# Patient Record
Sex: Male | Born: 1980 | Race: White | Hispanic: No | Marital: Married | Smoking: Current every day smoker
Health system: Southern US, Community
[De-identification: ages and names within clinical notes are randomized; demographics above are authoritative.]

## PROBLEM LIST (undated history)

## (undated) DIAGNOSIS — N201 Calculus of ureter: Secondary | ICD-10-CM

## (undated) DIAGNOSIS — Z8489 Family history of other specified conditions: Secondary | ICD-10-CM

## (undated) DIAGNOSIS — R51 Headache: Secondary | ICD-10-CM

## (undated) DIAGNOSIS — R3915 Urgency of urination: Secondary | ICD-10-CM

## (undated) DIAGNOSIS — R300 Dysuria: Secondary | ICD-10-CM

## (undated) DIAGNOSIS — R319 Hematuria, unspecified: Secondary | ICD-10-CM

## (undated) DIAGNOSIS — N200 Calculus of kidney: Secondary | ICD-10-CM

## (undated) DIAGNOSIS — R519 Headache, unspecified: Secondary | ICD-10-CM

---
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
## 2008-08-01 ENCOUNTER — Emergency Department (HOSPITAL_COMMUNITY): Admission: EM | Admit: 2008-08-01 | Discharge: 2008-08-01 | Payer: Self-pay | Admitting: Emergency Medicine

## 2017-09-10 ENCOUNTER — Emergency Department (HOSPITAL_COMMUNITY): Payer: Self-pay

## 2017-09-10 ENCOUNTER — Emergency Department (HOSPITAL_COMMUNITY)
Admission: EM | Admit: 2017-09-10 | Discharge: 2017-09-11 | Disposition: A | Discharge status: Home / Self Care | Payer: Self-pay | Attending: Emergency Medicine | Admitting: Emergency Medicine

## 2017-09-10 ENCOUNTER — Other Ambulatory Visit: Payer: Self-pay

## 2017-09-10 ENCOUNTER — Encounter (HOSPITAL_COMMUNITY): Payer: Self-pay | Admitting: *Deleted

## 2017-09-10 DIAGNOSIS — N132 Hydronephrosis with renal and ureteral calculous obstruction: Secondary | ICD-10-CM | POA: Insufficient documentation

## 2017-09-10 DIAGNOSIS — N201 Calculus of ureter: Secondary | ICD-10-CM | POA: Insufficient documentation

## 2017-09-10 DIAGNOSIS — F17210 Nicotine dependence, cigarettes, uncomplicated: Secondary | ICD-10-CM | POA: Insufficient documentation

## 2017-09-10 LAB — URINALYSIS, ROUTINE W REFLEX MICROSCOPIC
BACTERIA UA: NONE SEEN
Bilirubin Urine: NEGATIVE
Glucose, UA: 50 mg/dL — AB
KETONES UR: NEGATIVE mg/dL
NITRITE: NEGATIVE
PROTEIN: NEGATIVE mg/dL
Specific Gravity, Urine: 1.02 (ref 1.005–1.030)
pH: 5 (ref 5.0–8.0)

## 2017-09-10 LAB — LIPASE, BLOOD: Lipase: 34 U/L (ref 11–51)

## 2017-09-10 LAB — CBC WITH DIFFERENTIAL/PLATELET
BASOS PCT: 0 %
Basophils Absolute: 0 10*3/uL (ref 0.0–0.1)
Eosinophils Absolute: 0.3 10*3/uL (ref 0.0–0.7)
Eosinophils Relative: 4 %
HEMATOCRIT: 46.4 % (ref 39.0–52.0)
Hemoglobin: 15.8 g/dL (ref 13.0–17.0)
LYMPHS ABS: 3 10*3/uL (ref 0.7–4.0)
Lymphocytes Relative: 32 %
MCH: 31.3 pg (ref 26.0–34.0)
MCHC: 34.1 g/dL (ref 30.0–36.0)
MCV: 91.9 fL (ref 78.0–100.0)
MONOS PCT: 10 %
Monocytes Absolute: 1 10*3/uL (ref 0.1–1.0)
NEUTROS ABS: 5.1 10*3/uL (ref 1.7–7.7)
NEUTROS PCT: 54 %
Platelets: 219 10*3/uL (ref 150–400)
RBC: 5.05 MIL/uL (ref 4.22–5.81)
RDW: 13.9 % (ref 11.5–15.5)
WBC: 9.4 10*3/uL (ref 4.0–10.5)

## 2017-09-10 LAB — COMPREHENSIVE METABOLIC PANEL
ALBUMIN: 3.8 g/dL (ref 3.5–5.0)
ALT: 25 U/L (ref 17–63)
AST: 19 U/L (ref 15–41)
Alkaline Phosphatase: 68 U/L (ref 38–126)
Anion gap: 9 (ref 5–15)
BUN: 16 mg/dL (ref 6–20)
CHLORIDE: 106 mmol/L (ref 101–111)
CO2: 22 mmol/L (ref 22–32)
Calcium: 8.6 mg/dL — ABNORMAL LOW (ref 8.9–10.3)
Creatinine, Ser: 0.83 mg/dL (ref 0.61–1.24)
GFR calc Af Amer: >60 mL/min (ref >60)
GFR calc non Af Amer: >60 mL/min (ref >60)
GLUCOSE: 125 mg/dL — AB (ref 65–99)
POTASSIUM: 3.8 mmol/L (ref 3.5–5.1)
SODIUM: 137 mmol/L (ref 135–145)
Total Bilirubin: 0.4 mg/dL (ref 0.3–1.2)
Total Protein: 7 g/dL (ref 6.5–8.1)

## 2017-09-10 MED ORDER — OXYCODONE-ACETAMINOPHEN 5-325 MG PO TABS: 1.0000 | ORAL_TABLET | Freq: Three times a day (TID) | ORAL | 0 refills | Status: DC | PRN

## 2017-09-10 MED ORDER — KETOROLAC TROMETHAMINE 30 MG/ML IJ SOLN
30.0000 mg | Freq: Once | INTRAMUSCULAR | Status: AC
  Administered 2017-09-11: 30 mg via INTRAMUSCULAR
  Filled 2017-09-10: qty 1

## 2017-09-10 MED ORDER — TAMSULOSIN HCL 0.4 MG PO CAPS: 0.4000 mg | ORAL_CAPSULE | Freq: Every day | ORAL | 0 refills | Status: DC

## 2017-09-10 MED ORDER — OXYCODONE-ACETAMINOPHEN 5-325 MG PO TABS: 2.0000 | ORAL_TABLET | ORAL | 0 refills | Status: DC | PRN

## 2017-09-10 MED ORDER — FENTANYL CITRATE (PF) 100 MCG/2ML IJ SOLN
100.0000 ug | Freq: Once | INTRAMUSCULAR | Status: AC
Start: 2017-09-10 — End: 2017-09-10
  Administered 2017-09-10: 100 ug via INTRAVENOUS
  Filled 2017-09-10: qty 2

## 2017-09-10 NOTE — ED Notes (Signed)
Pt back from ct

## 2017-09-10 NOTE — ED Triage Notes (Addendum)
Pt reports llq pain and swelling x 4 days. Pt reports that when he takes a deep breath, the pain is worse (sharp, shooting). Pt denies any other symptoms.

## 2017-09-10 NOTE — ED Provider Notes (Signed)
[facility] EMERGENCY DEPARTMENT Provider Note   CSN: [id number] Arrival date & time: 09/10/17  1947     History   Chief Complaint Chief Complaint  Patient presents with  . Abdominal Pain    HPI Edward Booth is a 37 y.o. male.  HPI Patient presents with left-sided upper abdominal/flank pain.  He has had for the last few days.  Worse with movements.  Pain is worse when he has to urinate but no urinary frequency or pain with urinating.  No fevers or chills.  Pain is dull.  Has not had pains like this before.  Reportedly when he was a child had urinary tract infections but not have been recently.  No fevers.  No cough.  No trauma.  Worse with certain movements. History reviewed. No pertinent past medical history.  There are no active problems to display for this patient.   Past Surgical History:  Procedure Laterality Date  . HERNIA REPAIR          Home Medications    Prior to Admission medications   Medication Sig Start Date End Date Taking? Authorizing Provider  oxyCODONE-acetaminophen (PERCOCET/ROXICET) 5-325 MG tablet Take 2 tablets by mouth every 4 (four) hours as needed for severe pain. 09/10/17   Benjiman Core, MD  oxyCODONE-acetaminophen (PERCOCET/ROXICET) 5-325 MG tablet Take 1-2 tablets by mouth every 8 (eight) hours as needed for severe pain. 09/10/17   Benjiman Core, MD  tamsulosin (FLOMAX) 0.4 MG CAPS capsule Take 1 capsule (0.4 mg total) by mouth daily. 09/10/17   Benjiman Core, MD    Family History No family history on file.  Social History Social History   Tobacco Use  . Smoking status: Current Every Day Smoker    Packs/day: 1.00    Types: Cigarettes  . Smokeless tobacco: Never Used  Substance Use Topics  . Alcohol use: Yes    Comment: occasionally  . Drug use: Not on file     Allergies   Patient has no known allergies.   Review of Systems Review of Systems  Constitutional: Negative for appetite change.  HENT: Negative for  congestion.   Gastrointestinal: Positive for abdominal pain. Negative for blood in stool.  Genitourinary: Negative for dysuria.  Musculoskeletal: Positive for back pain.  Skin: Negative for rash.  Neurological: Negative for numbness.  Psychiatric/Behavioral: Negative for confusion.     Physical Exam Updated Vital Signs BP (!) 129/95 (BP Location: Right Arm)   Pulse 81   Temp 97.9 F (36.6 C) (Oral)   Resp 18   Ht  (1.803 m)   Wt (!) 145.2 kg (320 lb)   SpO2 95%   BMI 44.63 kg/m   Physical Exam  Constitutional: He appears well-developed.  Patient appears uncomfortable  HENT:  Head: Normocephalic.  Eyes: Pupils are equal, round, and reactive to light.  Cardiovascular: Regular rhythm.  Abdominal: Normal appearance. No hernia.  Some tenderness to left upper abdomen and left CVA area.  No hernia palpated.  Neurological: He is alert.  Skin: Skin is warm. Capillary refill takes less than 2 seconds.     ED Treatments / Results  Labs (all labs ordered are listed, but only abnormal results are displayed) Labs Reviewed  COMPREHENSIVE METABOLIC PANEL - Abnormal; Notable for the following components:      Result Value   Glucose, Bld 125 (*)    Calcium 8.6 (*)    All other components within normal limits  URINALYSIS, ROUTINE W REFLEX MICROSCOPIC - Abnormal; Notable for  the following components:   Glucose, UA 50 (*)    Hgb urine dipstick SMALL (*)    Leukocytes, UA TRACE (*)    All other components within normal limits  LIPASE, BLOOD  CBC WITH DIFFERENTIAL/PLATELET    EKG None  Radiology Ct Renal Stone Study  Result Date: 09/10/2017 CLINICAL DATA:  LEFT lower quadrant pain and swelling. Symptoms for 4 days. EXAM: CT ABDOMEN AND PELVIS WITHOUT CONTRAST TECHNIQUE: Multidetector CT imaging of the abdomen and pelvis was performed following the standard protocol without IV contrast. COMPARISON:  None. FINDINGS: Lower chest: No acute abnormality. Hepatobiliary: No focal  liver abnormality is seen. No gallstones, gallbladder wall thickening, or biliary dilatation. There is a tiny calcified granuloma of no clinical consequence. Pancreas: Unremarkable. No pancreatic ductal dilatation or surrounding inflammatory changes. Spleen: Normal in size without focal abnormality. Small accessory splenule. Adrenals/Urinary Tract: Adrenal glands are unremarkable. No renal masses. BILATERAL nephrolithiasis. LEFT hydronephrosis and hydroureter, secondary to a partially obstructing 5 mm ureteral stone opposite L3. Stomach/Bowel: Stomach is within normal limits. Appendix appears normal. No evidence of bowel wall thickening, distention, or inflammatory changes. Vascular/Lymphatic: No significant vascular findings are present. No enlarged abdominal or pelvic lymph nodes. Reproductive: Prostate is unremarkable. Other: No significant abdominal wall hernia or abnormality. Small fat containing umbilical hernia. No abdominopelvic ascites. Musculoskeletal: No acute or significant osseous findings. IMPRESSION: BILATERAL nephrolithiasis. LEFT hydronephrosis and hydroureter secondary to a proximal 5 mm ureteral stone Electronically Signed   By: Elsie Stain M.D.   On: 09/10/2017 23:35    Procedures Procedures (including critical care time)  Medications Ordered in ED Medications  ketorolac (TORADOL) 30 MG/ML injection 30 mg (has no administration in time range)  fentaNYL (SUBLIMAZE) injection 100 mcg (100 mcg Intravenous Given 09/10/17 2306)     Initial Impression / Assessment and Plan / ED Course  I have reviewed the triage vital signs and the nursing notes.  Pertinent labs & imaging results that were available during my care of the patient were reviewed by me and considered in my medical decision making (see chart for details).     Patient with left-sided abdominal pain.  Found to have ureteral stone.  Bilateral kidney stones.  Pain improved after treatment.  No infection.  Will discharge home  with pain control and Flomax since it is a large proximal stone.  Follow-up with urology.  Final Clinical Impressions(s) / ED Diagnoses   Final diagnoses:  Ureteral stone with hydronephrosis    ED Discharge Orders        Ordered    oxyCODONE-acetaminophen (PERCOCET/ROXICET) 5-325 MG tablet  Every 4 hours PRN     09/10/17 2353    tamsulosin (FLOMAX) 0.4 MG CAPS capsule  Daily     09/10/17 2354    oxyCODONE-acetaminophen (PERCOCET/ROXICET) 5-325 MG tablet  Every 8 hours PRN     09/10/17 2354       Benjiman Core, MD 09/11/17 0015

## 2017-09-10 NOTE — ED Notes (Signed)
ED Provider at bedside. 

## 2017-09-20 ENCOUNTER — Emergency Department (HOSPITAL_COMMUNITY): Payer: Self-pay

## 2017-09-20 ENCOUNTER — Encounter (HOSPITAL_COMMUNITY): Payer: Self-pay | Admitting: Emergency Medicine

## 2017-09-20 ENCOUNTER — Emergency Department (HOSPITAL_COMMUNITY)
Admission: EM | Admit: 2017-09-20 | Discharge: 2017-09-21 | Disposition: A | Discharge status: Home / Self Care | Payer: Self-pay | Attending: Emergency Medicine | Admitting: Emergency Medicine

## 2017-09-20 ENCOUNTER — Other Ambulatory Visit: Payer: Self-pay

## 2017-09-20 DIAGNOSIS — N201 Calculus of ureter: Secondary | ICD-10-CM | POA: Insufficient documentation

## 2017-09-20 DIAGNOSIS — F17210 Nicotine dependence, cigarettes, uncomplicated: Secondary | ICD-10-CM | POA: Insufficient documentation

## 2017-09-20 DIAGNOSIS — N390 Urinary tract infection, site not specified: Secondary | ICD-10-CM | POA: Insufficient documentation

## 2017-09-20 LAB — URINALYSIS, ROUTINE W REFLEX MICROSCOPIC
Bilirubin Urine: NEGATIVE
Glucose, UA: 50 mg/dL — AB
KETONES UR: NEGATIVE mg/dL
LEUKOCYTES UA: NEGATIVE
NITRITE: NEGATIVE
PROTEIN: NEGATIVE mg/dL
Specific Gravity, Urine: 1.023 (ref 1.005–1.030)
pH: 5 (ref 5.0–8.0)

## 2017-09-20 LAB — BASIC METABOLIC PANEL
ANION GAP: 7 (ref 5–15)
BUN: 15 mg/dL (ref 6–20)
CO2: 24 mmol/L (ref 22–32)
Calcium: 8.9 mg/dL (ref 8.9–10.3)
Chloride: 107 mmol/L (ref 101–111)
Creatinine, Ser: 0.71 mg/dL (ref 0.61–1.24)
Glucose, Bld: 99 mg/dL (ref 65–99)
POTASSIUM: 3.8 mmol/L (ref 3.5–5.1)
SODIUM: 138 mmol/L (ref 135–145)

## 2017-09-20 LAB — CBC WITH DIFFERENTIAL/PLATELET
BASOS ABS: 0 10*3/uL (ref 0.0–0.1)
BASOS PCT: 0 %
EOS ABS: 0.3 10*3/uL (ref 0.0–0.7)
Eosinophils Relative: 3 %
HCT: 45.9 % (ref 39.0–52.0)
Hemoglobin: 15.8 g/dL (ref 13.0–17.0)
LYMPHS PCT: 27 %
Lymphs Abs: 2.6 10*3/uL (ref 0.7–4.0)
MCH: 31.5 pg (ref 26.0–34.0)
MCHC: 34.4 g/dL (ref 30.0–36.0)
MCV: 91.4 fL (ref 78.0–100.0)
Monocytes Absolute: 1.2 10*3/uL — ABNORMAL HIGH (ref 0.1–1.0)
Monocytes Relative: 12 %
Neutro Abs: 5.6 10*3/uL (ref 1.7–7.7)
Neutrophils Relative %: 58 %
PLATELETS: 246 10*3/uL (ref 150–400)
RBC: 5.02 MIL/uL (ref 4.22–5.81)
RDW: 13.8 % (ref 11.5–15.5)
WBC: 9.6 10*3/uL (ref 4.0–10.5)

## 2017-09-20 MED ORDER — ONDANSETRON HCL 4 MG/2ML IJ SOLN
4.0000 mg | Freq: Once | INTRAMUSCULAR | Status: AC
  Administered 2017-09-20: 4 mg via INTRAVENOUS
  Filled 2017-09-20: qty 2

## 2017-09-20 MED ORDER — FENTANYL CITRATE (PF) 100 MCG/2ML IJ SOLN
50.0000 ug | Freq: Once | INTRAMUSCULAR | Status: AC
  Administered 2017-09-20: 50 ug via INTRAVENOUS
  Filled 2017-09-20: qty 2

## 2017-09-20 MED ORDER — KETOROLAC TROMETHAMINE 30 MG/ML IJ SOLN
15.0000 mg | Freq: Once | INTRAMUSCULAR | Status: AC
  Administered 2017-09-20: 15 mg via INTRAVENOUS
  Filled 2017-09-20: qty 1

## 2017-09-20 MED ORDER — SODIUM CHLORIDE 0.9 % IV SOLN
2.0000 g | Freq: Once | INTRAVENOUS | Status: AC
  Administered 2017-09-20: 2 g via INTRAVENOUS
  Filled 2017-09-20: qty 20

## 2017-09-20 MED ORDER — SODIUM CHLORIDE 0.9 % IV BOLUS
1000.0000 mL | Freq: Once | INTRAVENOUS | Status: AC
  Administered 2017-09-20: 1000 mL via INTRAVENOUS

## 2017-09-20 MED ORDER — HYDROMORPHONE HCL 1 MG/ML IJ SOLN
1.0000 mg | Freq: Once | INTRAMUSCULAR | Status: AC
  Administered 2017-09-20: 1 mg via INTRAVENOUS
  Filled 2017-09-20: qty 1

## 2017-09-20 NOTE — ED Provider Notes (Signed)
Plains of left flank pain onset prostate 10 days ago. Patient diagnosedwith ureteral colic and left-sided proximal ureteral stone on 09/10/2017. He developed temperature of 100.9 earlier today coming by nausea without vomiting. No other complaint.   Doug Sou, MD 09/20/17 [phone]

## 2017-09-20 NOTE — ED Triage Notes (Signed)
Patient c/o left flank pain. Patient states seen here last week and diagnosed with 5mm kidney stone that was causing a blockage and swelling of kidney. Patient instructed to come back for increased pain and  Fever. Patient finished Flomax. Patient stated having fever and increased pain. Patient unable to see urologist yet because insurance does not start in July.

## 2017-09-21 ENCOUNTER — Ambulatory Visit (HOSPITAL_COMMUNITY): Payer: Self-pay | Admitting: Certified Registered Nurse Anesthetist

## 2017-09-21 ENCOUNTER — Encounter (HOSPITAL_COMMUNITY): Payer: Self-pay | Admitting: *Deleted

## 2017-09-21 ENCOUNTER — Encounter (HOSPITAL_COMMUNITY)
Admission: RE | Disposition: A | Discharge status: Home / Self Care | Payer: Self-pay | Source: Ambulatory Visit | Attending: Urology

## 2017-09-21 ENCOUNTER — Ambulatory Visit (HOSPITAL_COMMUNITY)
Admission: RE | Admit: 2017-09-21 | Discharge: 2017-09-21 | Disposition: A | Discharge status: Home / Self Care | Payer: Self-pay | Source: Ambulatory Visit | Attending: Urology | Admitting: Urology

## 2017-09-21 ENCOUNTER — Other Ambulatory Visit: Payer: Self-pay | Admitting: Urology

## 2017-09-21 ENCOUNTER — Ambulatory Visit (HOSPITAL_COMMUNITY): Payer: Self-pay

## 2017-09-21 ENCOUNTER — Other Ambulatory Visit: Payer: Self-pay

## 2017-09-21 DIAGNOSIS — F17200 Nicotine dependence, unspecified, uncomplicated: Secondary | ICD-10-CM | POA: Insufficient documentation

## 2017-09-21 DIAGNOSIS — R509 Fever, unspecified: Secondary | ICD-10-CM | POA: Insufficient documentation

## 2017-09-21 DIAGNOSIS — Z6841 Body Mass Index (BMI) 40.0 and over, adult: Secondary | ICD-10-CM | POA: Insufficient documentation

## 2017-09-21 DIAGNOSIS — N201 Calculus of ureter: Secondary | ICD-10-CM | POA: Insufficient documentation

## 2017-09-21 HISTORY — DX: Headache: R51

## 2017-09-21 HISTORY — PX: CYSTOSCOPY W/ URETERAL STENT PLACEMENT: SHX1429

## 2017-09-21 HISTORY — DX: Headache, unspecified: R51.9

## 2017-09-21 SURGERY — CYSTOSCOPY, WITH RETROGRADE PYELOGRAM AND URETERAL STENT INSERTION
Anesthesia: General | Site: Ureter | Laterality: Left

## 2017-09-21 MED ORDER — LIDOCAINE 2% (20 MG/ML) 5 ML SYRINGE
INTRAMUSCULAR | Status: DC | PRN
  Administered 2017-09-21: 100 mg via INTRAVENOUS

## 2017-09-21 MED ORDER — OXYCODONE-ACETAMINOPHEN 5-325 MG PO TABS
1.0000 | ORAL_TABLET | ORAL | Status: DC | PRN
  Administered 2017-09-21: 1 via ORAL

## 2017-09-21 MED ORDER — FENTANYL CITRATE (PF) 100 MCG/2ML IJ SOLN
25.0000 ug | INTRAMUSCULAR | Status: DC | PRN
  Administered 2017-09-21 (×2): 50 ug via INTRAVENOUS

## 2017-09-21 MED ORDER — IOHEXOL 300 MG/ML  SOLN
INTRAMUSCULAR | Status: DC | PRN
  Administered 2017-09-21: 20 mL via URETHRAL

## 2017-09-21 MED ORDER — FENTANYL CITRATE (PF) 100 MCG/2ML IJ SOLN
INTRAMUSCULAR | Status: AC
  Filled 2017-09-21: qty 2

## 2017-09-21 MED ORDER — KETOROLAC TROMETHAMINE 30 MG/ML IJ SOLN
INTRAMUSCULAR | Status: AC
  Filled 2017-09-21: qty 1

## 2017-09-21 MED ORDER — HYDRALAZINE HCL 20 MG/ML IJ SOLN
10.0000 mg | Freq: Once | INTRAMUSCULAR | Status: AC
  Administered 2017-09-21: 10 mg via INTRAVENOUS

## 2017-09-21 MED ORDER — CEFAZOLIN SODIUM-DEXTROSE 2-4 GM/100ML-% IV SOLN
2.0000 g | Freq: Once | INTRAVENOUS | Status: AC
  Administered 2017-09-21: 2 g via INTRAVENOUS
  Filled 2017-09-21: qty 100

## 2017-09-21 MED ORDER — MIDAZOLAM HCL 2 MG/2ML IJ SOLN
INTRAMUSCULAR | Status: AC
  Filled 2017-09-21: qty 2

## 2017-09-21 MED ORDER — PROMETHAZINE HCL 25 MG/ML IJ SOLN: 6.2500 mg | INTRAMUSCULAR | Status: DC | PRN

## 2017-09-21 MED ORDER — OXYCODONE-ACETAMINOPHEN 5-325 MG PO TABS: 2.0000 | ORAL_TABLET | ORAL | 0 refills | Status: DC | PRN

## 2017-09-21 MED ORDER — HYDRALAZINE HCL 20 MG/ML IJ SOLN
INTRAMUSCULAR | Status: AC
  Administered 2017-09-21: 10 mg via INTRAVENOUS
  Filled 2017-09-21: qty 1

## 2017-09-21 MED ORDER — SODIUM CHLORIDE 0.9 % IR SOLN
Status: DC | PRN
  Administered 2017-09-21: 3000 mL

## 2017-09-21 MED ORDER — FENTANYL CITRATE (PF) 100 MCG/2ML IJ SOLN
INTRAMUSCULAR | Status: AC
  Administered 2017-09-21: 50 ug via INTRAVENOUS
  Filled 2017-09-21: qty 2

## 2017-09-21 MED ORDER — TAMSULOSIN HCL 0.4 MG PO CAPS: 0.4000 mg | ORAL_CAPSULE | Freq: Every day | ORAL | 3 refills | Status: DC

## 2017-09-21 MED ORDER — CIPROFLOXACIN HCL 500 MG PO TABS: 500.0000 mg | ORAL_TABLET | Freq: Two times a day (BID) | ORAL | 0 refills | Status: AC

## 2017-09-21 MED ORDER — OXYCODONE-ACETAMINOPHEN 5-325 MG PO TABS: 1.0000 | ORAL_TABLET | ORAL | 0 refills | Status: DC | PRN

## 2017-09-21 MED ORDER — FENTANYL CITRATE (PF) 100 MCG/2ML IJ SOLN
INTRAMUSCULAR | Status: DC | PRN
  Administered 2017-09-21 (×2): 50 ug via INTRAVENOUS

## 2017-09-21 MED ORDER — 0.9 % SODIUM CHLORIDE (POUR BTL) OPTIME
TOPICAL | Status: DC | PRN
  Administered 2017-09-21: 1000 mL

## 2017-09-21 MED ORDER — KETOROLAC TROMETHAMINE 30 MG/ML IJ SOLN
30.0000 mg | Freq: Once | INTRAMUSCULAR | Status: AC | PRN
  Administered 2017-09-21: 30 mg via INTRAVENOUS

## 2017-09-21 MED ORDER — CEPHALEXIN 500 MG PO CAPS: 500.0000 mg | ORAL_CAPSULE | Freq: Four times a day (QID) | ORAL | 0 refills | Status: DC

## 2017-09-21 MED ORDER — PROPOFOL 10 MG/ML IV BOLUS
INTRAVENOUS | Status: DC | PRN
  Administered 2017-09-21: 200 mg via INTRAVENOUS

## 2017-09-21 MED ORDER — LACTATED RINGERS IV SOLN
INTRAVENOUS | Status: DC
  Administered 2017-09-21 (×2): via INTRAVENOUS

## 2017-09-21 MED ORDER — DEXAMETHASONE SODIUM PHOSPHATE 10 MG/ML IJ SOLN
INTRAMUSCULAR | Status: DC | PRN
  Administered 2017-09-21: 10 mg via INTRAVENOUS

## 2017-09-21 MED ORDER — MIDAZOLAM HCL 2 MG/2ML IJ SOLN
INTRAMUSCULAR | Status: DC | PRN
  Administered 2017-09-21 (×2): 1 mg via INTRAVENOUS

## 2017-09-21 MED ORDER — ONDANSETRON HCL 4 MG/2ML IJ SOLN
INTRAMUSCULAR | Status: DC | PRN
  Administered 2017-09-21: 4 mg via INTRAVENOUS

## 2017-09-21 MED ORDER — OXYCODONE-ACETAMINOPHEN 5-325 MG PO TABS
ORAL_TABLET | ORAL | Status: AC
  Administered 2017-09-21: 1 via ORAL
  Filled 2017-09-21: qty 1

## 2017-09-21 MED ORDER — PROPOFOL 10 MG/ML IV BOLUS
INTRAVENOUS | Status: AC
  Filled 2017-09-21: qty 20

## 2017-09-21 SURGICAL SUPPLY — 13 items
BAG URO CATCHER STRL LF (MISCELLANEOUS) ×3 IMPLANT
CATH INTERMIT  6FR 70CM (CATHETERS) ×3 IMPLANT
CLOTH BEACON ORANGE TIMEOUT ST (SAFETY) ×3 IMPLANT
COVER FOOTSWITCH UNIV (MISCELLANEOUS) IMPLANT
COVER SURGICAL LIGHT HANDLE (MISCELLANEOUS) ×3 IMPLANT
GLOVE BIOGEL M STRL SZ7.5 (GLOVE) ×3 IMPLANT
GOWN STRL REUS W/TWL LRG LVL3 (GOWN DISPOSABLE) ×6 IMPLANT
GUIDEWIRE STR DUAL SENSOR (WIRE) ×3 IMPLANT
MANIFOLD NEPTUNE II (INSTRUMENTS) ×3 IMPLANT
PACK CYSTO (CUSTOM PROCEDURE TRAY) ×3 IMPLANT
STENT URET 6FRX26 CONTOUR (STENTS) ×3 IMPLANT
TUBING CONNECTING 10 (TUBING) ×2 IMPLANT
TUBING CONNECTING 10' (TUBING) ×1

## 2017-09-21 NOTE — Interval H&P Note (Signed)
History and Physical Interval Note:  09/21/2017 3:52 PM  Edward Booth  has presented today for surgery, with the diagnosis of left ureteral calculus  The various methods of treatment have been discussed with the patient and family. After consideration of risks, benefits and other options for treatment, the patient has consented to  Procedure(s): CYSTOSCOPY WITH RETROGRADE PYELOGRAM/URETERAL STENT PLACEMENT (Left) as a surgical intervention .  The patient's history has been reviewed, patient examined, no change in status, stable for surgery.  I have reviewed the patient's chart and labs.  Questions were answered to the patient's satisfaction.     Ray Church, III

## 2017-09-21 NOTE — H&P (Signed)
CC: I have kidney stones.  HPI: Edward Booth is a 37 year-old male patient who is here for renal calculi.  The problem is on the left side. He first stated noticing pain on 09/10/2017. This is his first kidney stone. He is currently having flank pain, back pain, nausea, and fever. He denies having groin pain, vomiting, and chills. He has not caught a stone in his urine strainer since his symptoms began.   He has never had surgical treatment for calculi in the past.   Patient initially had pain on 09/10/2017 prompting a visit to the emergency department. This revealed a 5 mm left proximal ureteral calculus. He continues to experience left-sided flank pain.Marland Kitchen He also developed a fever last night and today. He took his temperature and it was up to 101.3. He is afebrile here. Denies any dysuria. He does have some groin pain. Urinalysis is not really consistent with infection but he does have subjective fever.   ALLERGIES: aspertame   MEDICATIONS: Oxycodone Hcl 5 mg capsule    GU PSH: None   NON-GU PSH: Hernia Repair, Left   GU PMH: Renal calculus   NON-GU PMH: Gout   FAMILY HISTORY: Diabetes - Mother Heart Attack - Father Heart Disease - Mother, Father Kidney Failure - Mother Tuberculosis - Grandfather   SOCIAL HISTORY: Marital Status: Married Preferred Language: English; Race: White Current Smoking Status: Patient smokes.   Tobacco Use Assessment Completed:  Used Tobacco in last 30 days?  Drinks 1 caffeinated drink per day.   REVIEW OF SYSTEMS:    GU Review Male:   Patient reports frequent urination, get up at night to urinate, stream starts and stops, and have to strain to urinate . Patient denies hard to postpone urination, burning/ pain with urination, leakage of urine, trouble starting your stream, erection problems, and penile pain.  Gastrointestinal (Upper):   Patient reports nausea. Patient denies vomiting and indigestion/ heartburn.  Gastrointestinal (Lower):   Patient denies  diarrhea and constipation.  Constitutional:   Patient reports fever and fatigue. Patient denies night sweats and weight loss.  Skin:   Patient denies skin rash/ lesion and itching.  Eyes:   Patient denies blurred vision and double vision.  Ears/ Nose/ Throat:   Patient denies sore throat and sinus problems.  Hematologic/Lymphatic:   Patient denies swollen glands and easy bruising.  Cardiovascular:   Patient denies leg swelling and chest pains.  Respiratory:   Patient reports shortness of breath and cough.   Endocrine:   Patient reports excessive thirst.   Musculoskeletal:   Patient reports back pain. Patient denies joint pain.  Neurological:   Patient denies headaches and dizziness.  Psychologic:   Patient denies depression and anxiety.   Notes: blood in urine   VITAL SIGNS: None   MULTI-SYSTEM PHYSICAL EXAMINATION:    Constitutional: Well-nourished. No physical deformities. Normally developed. Good grooming.  Respiratory: No labored breathing, no use of accessory muscles.   Cardiovascular: Normal temperature, adequate perfusion of extremities  Skin: No paleness, no jaundice  Neurologic / Psychiatric: Oriented to time, oriented to place, oriented to person. No depression, no anxiety, no agitation.  Gastrointestinal: No mass, no tenderness, no rigidity, obese abdomen.   Eyes: Normal conjunctivae. Normal eyelids.  Musculoskeletal: Normal gait and station of head and neck.    PAST DATA REVIEWED:  Source Of History:  Patient  Records Review:   Previous Patient Records  X-Ray Review: C.T. Abdomen/Pelvis: Reviewed Films. Reviewed Report. Discussed With Patient.    PROCEDURES:  Urinalysis - 81003 Dipstick Dipstick Cont'd  Color: Yellow Bilirubin: Neg  Appearance: Clear Ketones: Neg  Specific Gravity: 1.025 Blood: Neg  pH: <=5.0 Protein: Trace  Glucose: Neg Urobilinogen: 0.2    Nitrites: Neg    Leukocyte Esterase: Neg   Notes:    ASSESSMENT:      ICD-10 Details  1 GU:    Renal and ureteral calculus - N20.2   2 NON-GU:   Fever, unspecified - R50.9    PLAN:          Orders Labs Urine Culture         Document Letter(s):  Created for Patient: Clinical Summary        Notes:   Given the patient's fever earlier today, recommend proceeding to the operating room with urgent left ureteral stent placement today. He will need a left ureteroscopy with laser lithotripsy and ureteral stent placement at a later date.   Signed by Modena Slater, III, M.D. on 09/21/17 at 12:31 PM (EDT

## 2017-09-21 NOTE — Discharge Instructions (Addendum)

## 2017-09-21 NOTE — ED Provider Notes (Signed)
[facility] EMERGENCY DEPARTMENT Provider Note   CSN: [id number] Arrival date & time: 09/20/17  1808     History   Chief Complaint Chief Complaint  Patient presents with  . Flank Pain    HPI Edward Booth is a 37 y.o. male with recently diagnosed bilateral renal stones in addition to a 5 mm proximal left ureteral stone per CT scan on May 10 and treated with pain medication and a 5-day course of Flomax returns this evening with worsening pain but additionally has had low-grade fevers to 100.9 earlier today in association with increasing dysuria and left flank pain which now also radiates into his left lower abdomen.  He continues to strain his urine but is yet to pass the stone.  He has been taking oxycodone which partially controls his pain.  He has had decreased appetite today.  He denies vomiting but has had some mild nausea.  He has been tolerating p.o. intake.  He denies hematuria or cloudy urine, but his urine has been a little bit darker over the past several days.  The history is provided by the patient and the spouse.    Past Medical History:  Diagnosis Date  . Renal disorder     There are no active problems to display for this patient.   Past Surgical History:  Procedure Laterality Date  . HERNIA REPAIR          Home Medications    Prior to Admission medications   Medication Sig Start Date End Date Taking? Authorizing Provider  naproxen sodium (ALEVE) 220 MG tablet Take 220 mg by mouth as needed (headache).   Yes [provider]  cephALEXin (KEFLEX) 500 MG capsule Take 1 capsule (500 mg total) by mouth 4 (four) times daily. 09/21/17   Burgess Amor, PA-C  oxyCODONE-acetaminophen (PERCOCET/ROXICET) 5-325 MG tablet Take 2 tablets by mouth every 4 (four) hours as needed for severe pain. 09/21/17   Burgess Amor, PA-C  oxyCODONE-acetaminophen (PERCOCET/ROXICET) 5-325 MG tablet Take 1-2 tablets by mouth every 4 (four) hours as needed. 09/21/17   Burgess Amor, PA-C   tamsulosin (FLOMAX) 0.4 MG CAPS capsule Take 1 capsule (0.4 mg total) by mouth daily. Patient not taking: Reported on 09/20/2017 09/10/17   Benjiman Core, MD    Family History No family history on file.  Social History Social History   Tobacco Use  . Smoking status: Current Every Day Smoker    Packs/day: 1.00    Types: Cigarettes  . Smokeless tobacco: Never Used  Substance Use Topics  . Alcohol use: Yes    Comment: occasionally  . Drug use: Never     Allergies   Aspartame and phenylalanine   Review of Systems Review of Systems  Constitutional: Positive for fever.  HENT: Negative for congestion and sore throat.   Eyes: Negative.   Respiratory: Negative for chest tightness and shortness of breath.   Cardiovascular: Negative for chest pain.  Gastrointestinal: Positive for nausea. Negative for abdominal pain and vomiting.  Genitourinary: Positive for flank pain and frequency. Negative for decreased urine volume, penile pain and urgency.  Musculoskeletal: Negative for arthralgias, joint swelling and neck pain.  Skin: Negative.  Negative for rash and wound.  Neurological: Negative for dizziness, weakness, light-headedness, numbness and headaches.  Psychiatric/Behavioral: Negative.      Physical Exam Updated Vital Signs BP 131/80   Pulse 83   Temp 98.4 F (36.9 C) (Oral)   Resp 16   Ht  (1.803 m)  Wt (!) 145.2 kg (320 lb)   SpO2 95%   BMI 44.63 kg/m   Physical Exam  Constitutional: He appears well-developed and well-nourished. No distress.  Appears uncomfortable  HENT:  Head: Normocephalic and atraumatic.  Eyes: Conjunctivae are normal.  Cardiovascular: Normal rate, regular rhythm, normal heart sounds and intact distal pulses.  Pulmonary/Chest: Effort normal and breath sounds normal. He has no wheezes.  Abdominal: Soft. Bowel sounds are normal. There is tenderness. There is CVA tenderness. There is no rebound and no guarding.  ttp left flank  radiating to left mid abdomen  Musculoskeletal: Normal range of motion.  Neurological: He is alert.  Skin: Skin is warm and dry.  Psychiatric: He has a normal mood and affect.  Nursing note and vitals reviewed.    ED Treatments / Results  Labs (all labs ordered are listed, but only abnormal results are displayed) Results for orders placed or performed during the hospital encounter of 09/20/17  Urinalysis, Routine w reflex microscopic  Result Value Ref Range   Color, Urine YELLOW YELLOW   APPearance CLEAR CLEAR   Specific Gravity, Urine 1.023 1.005 - 1.030   pH 5.0 5.0 - 8.0   Glucose, UA 50 (A) NEGATIVE mg/dL   Hgb urine dipstick SMALL (A) NEGATIVE   Bilirubin Urine NEGATIVE NEGATIVE   Ketones, ur NEGATIVE NEGATIVE mg/dL   Protein, ur NEGATIVE NEGATIVE mg/dL   Nitrite NEGATIVE NEGATIVE   Leukocytes, UA NEGATIVE NEGATIVE   RBC / HPF 0-5 0 - 5 RBC/hpf   WBC, UA 21-50 0 - 5 WBC/hpf   Bacteria, UA RARE (A) NONE SEEN   Squamous Epithelial / LPF 0-5 0 - 5   Mucus PRESENT   CBC with Differential  Result Value Ref Range   WBC 9.6 4.0 - 10.5 K/uL   RBC 5.02 4.22 - 5.81 MIL/uL   Hemoglobin 15.8 13.0 - 17.0 g/dL   HCT 16.1 09.6 - 04.5 %   MCV 91.4 78.0 - 100.0 fL   MCH 31.5 26.0 - 34.0 pg   MCHC 34.4 30.0 - 36.0 g/dL   RDW 40.9 81.1 - 91.4 %   Platelets 246 150 - 400 K/uL   Neutrophils Relative % 58 %   Neutro Abs 5.6 1.7 - 7.7 K/uL   Lymphocytes Relative 27 %   Lymphs Abs 2.6 0.7 - 4.0 K/uL   Monocytes Relative 12 %   Monocytes Absolute 1.2 (H) 0.1 - 1.0 K/uL   Eosinophils Relative 3 %   Eosinophils Absolute 0.3 0.0 - 0.7 K/uL   Basophils Relative 0 %   Basophils Absolute 0.0 0.0 - 0.1 K/uL  Basic metabolic panel  Result Value Ref Range   Sodium 138 135 - 145 mmol/L   Potassium 3.8 3.5 - 5.1 mmol/L   Chloride 107 101 - 111 mmol/L   CO2 24 22 - 32 mmol/L   Glucose, Bld 99 65 - 99 mg/dL   BUN 15 6 - 20 mg/dL   Creatinine, Ser 7.82 0.61 - 1.24 mg/dL   Calcium 8.9 8.9 -  95.6 mg/dL   GFR calc non Af Amer >60 >60 mL/min   GFR calc Af Amer >60 >60 mL/min   Anion gap 7 5 - 15      EKG None  Radiology Dg Abdomen 1 View  Result Date: 09/20/2017 CLINICAL DATA:  Left flank pain.  Known left ureteral stone. EXAM: ABDOMEN - 1 VIEW COMPARISON:  CT 09/10/2013 FINDINGS: Possible small stone projects between the L2 and L3 left  transverse processes and may reflect previously seen left ureteral stone. Nonobstructive bowel gas pattern. No free air or organomegaly. IMPRESSION: Possible small left mid ureteral stone between the L2 and L3 left transverse processes. Electronically Signed   By: Charlett Nose M.D.   On: 09/20/2017 20:48    Procedures Procedures (including critical care time)  Medications Ordered in ED Medications  HYDROmorphone (DILAUDID) injection 1 mg (1 mg Intravenous Given 09/20/17 1926)  ondansetron (ZOFRAN) injection 4 mg (4 mg Intravenous Given 09/20/17 1926)  ketorolac (TORADOL) 30 MG/ML injection 15 mg (15 mg Intravenous Given 09/20/17 2129)  cefTRIAXone (ROCEPHIN) 2 g in sodium chloride 0.9 % 100 mL IVPB (0 g Intravenous Stopped 09/21/17 0009)  fentaNYL (SUBLIMAZE) injection 50 mcg (50 mcg Intravenous Given 09/20/17 2328)  sodium chloride 0.9 % bolus 1,000 mL (1,000 mLs Intravenous New Bag/Given 09/20/17 2328)     Initial Impression / Assessment and Plan / ED Course  I have reviewed the triage vital signs and the nursing notes.  Pertinent labs & imaging results that were available during my care of the patient were reviewed by me and considered in my medical decision making (see chart for details).     Pt with persistent pain with known left 5 mm ureteral stone.  Fever to 100.9 prior to arrival, tx with tylenol,  with 21-50 wbc;'s in urine, but rare bacteria.  Urine cx sent.  Discussed with Dr. Marlou Porch with [facility] - advised pt will need to be seen in their office in the morning. Rocephin 2 gram IV now, IV fluids.  NPO after MN.   Discussed with pt and wife who are in agreement with plan. He was prescribed keflex for home, also per Dr Herrick's req.   Pt with stable vs here, stable for dc home with close f/u in am.  Final Clinical Impressions(s) / ED Diagnoses   Final diagnoses:  Ureterolithiasis  Lower urinary tract infectious disease    ED Discharge Orders        Ordered    cephALEXin (KEFLEX) 500 MG capsule  4 times daily     09/21/17 0046    oxyCODONE-acetaminophen (PERCOCET/ROXICET) 5-325 MG tablet  Every 4 hours PRN     09/21/17 0046    oxyCODONE-acetaminophen (PERCOCET/ROXICET) 5-325 MG tablet  Every 4 hours PRN     09/21/17 0047       Burgess Amor, PA-C 09/21/17 0116    Doug Sou, MD 09/21/17 1444

## 2017-09-21 NOTE — Discharge Instructions (Addendum)
As discussed,  we spoke with with Dr. Marlou Porch with [facility] who instructs that you will need to be seen by the urologist on call for their group in the morning given you now have infection complicating this kidney stone.  Call their office in the morning for an appointment time.  You need to not have any food or water to drink after midnight until you are seen by them (a small sip of water with your pain pill is ok). Do not drive within 4 hours of taking your oxycodone as this will make you sleepy.

## 2017-09-21 NOTE — Anesthesia Procedure Notes (Signed)
Date/Time: 09/21/2017 6:10 PM Performed by: Minerva Ends, CRNA Oxygen Delivery Method: Simple face mask Placement Confirmation: positive ETCO2 and breath sounds checked- equal and bilateral Dental Injury: Teeth and Oropharynx as per pre-operative assessment

## 2017-09-21 NOTE — Anesthesia Preprocedure Evaluation (Addendum)
Anesthesia Evaluation  Patient identified by MRN, date of birth, ID band Patient awake    Reviewed: Allergy & Precautions, NPO status , Patient's Chart, lab work & pertinent test results  Airway Mallampati: II  TM Distance: >3 FB Neck ROM: Full    Dental  (+) Loose, Dental Advisory Given, Poor Dentition,    Pulmonary Current Smoker,    Pulmonary exam normal breath sounds clear to auscultation       Cardiovascular negative cardio ROS Normal cardiovascular exam Rhythm:Regular Rate:Normal     Neuro/Psych negative neurological ROS  negative psych ROS   GI/Hepatic negative GI ROS, Neg liver ROS,   Endo/Other  Morbid obesity  Renal/GU negative Renal ROS  negative genitourinary   Musculoskeletal negative musculoskeletal ROS (+)   Abdominal   Peds negative pediatric ROS (+)  Hematology negative hematology ROS (+)   Anesthesia Other Findings   Reproductive/Obstetrics negative OB ROS                            Anesthesia Physical Anesthesia Plan  ASA: III  Anesthesia Plan: General   Post-op Pain Management:    Induction: Intravenous  PONV Risk Score and Plan: 1 and Ondansetron, Dexamethasone and Treatment may vary due to age or medical condition  Airway Management Planned: LMA and Oral ETT  Additional Equipment:   Intra-op Plan:   Post-operative Plan:   Informed Consent: I have reviewed the patients History and Physical, chart, labs and discussed the procedure including the risks, benefits and alternatives for the proposed anesthesia with the patient or authorized representative who has indicated his/her understanding and acceptance.   Dental advisory given  Plan Discussed with: CRNA and Surgeon  Anesthesia Plan Comments:        Anesthesia Quick Evaluation

## 2017-09-21 NOTE — Op Note (Signed)
Operative Note  Preoperative diagnosis:  1.  Left ureteral calculus  Post operative diagnosis: 1.  Left ureteral calculus  Procedure(s): 1.  Cystoscopy with left retrograde pyelogram and left ureteral stent placement  Surgeon: Modena Slater, MD  Assistants: None  Anesthesia: General  Complications: None immediate  EBL: Minimal  Specimens: 1.  None  Drains/Catheters: 1.  6 X 26 double-J ureteral stent  Intraoperative findings: 1.  Normal urethra and bladder 2.  Left retrograde pyelogram revealed a filling defect at the level of the stone with upstream hydroureteronephrosis  Indication: 37 year old male with a left ureteral calculus presented to the office today with subjective complaint of fever to 101.3.  He was otherwise afebrile with vital signs stable in the office but given his subjective fever, the decision was made to proceed with ureteral stent placement followed by treatment with antibiotics prior to considering definitive management of the stone.  Description of procedure:  The patient was identified and consent was obtained.  The patient was taken to the operating room and placed in the supine position.  The patient was placed under general anesthesia.  Perioperative antibiotics were administered.  The patient was placed in dorsal lithotomy.  Patient was prepped and draped in a standard sterile fashion and a timeout was performed.  A 21 French rigid cystoscope was advanced into the urethra and into the bladder.  The left distal most portion of the ureter was cannulated with an open-ended ureteral catheter.  Retrograde pyelogram was performed with the findings noted above.  A sensor wire was then advanced up to the kidney under fluoroscopic guidance.  A 6 X 26 double-J ureteral stent was advanced up to the kidney under fluoroscopic guidance.  The wire was withdrawn and fluoroscopy confirmed good proximal placement and direct visualization confirmed a good coil within the  bladder.  The bladder was drained and the scope withdrawn.  This concluded the operation.  Patient tolerated procedure well and was stable postoperatively.  Plan: Follow-up in 1 to 2 weeks for scheduling of surgery and for urine culture.

## 2017-09-21 NOTE — Transfer of Care (Signed)
Immediate Anesthesia Transfer of Care Note  Patient: Edward Booth  Procedure(s) Performed: CYSTOSCOPY WITH RETROGRADE PYELOGRAM/URETERAL STENT PLACEMENT (Left Ureter)  Patient Location: PACU  Anesthesia Type:General  Level of Consciousness: awake and alert   Airway & Oxygen Therapy: Patient Spontanous Breathing and Patient connected to face mask oxygen  Post-op Assessment: Report given to RN and Post -op Vital signs reviewed and stable  Post vital signs: Reviewed and stable  Last Vitals:  Vitals Value Taken Time  BP 155/105 09/21/2017  6:18 PM  Temp    Pulse 92 09/21/2017  6:20 PM  Resp 16 09/21/2017  6:20 PM  SpO2 94 % 09/21/2017  6:20 PM  Vitals shown include unvalidated device data.  Last Pain:  Vitals:   09/21/17 1607  TempSrc:   PainSc: 5       Patients Stated Pain Goal: 5 (09/21/17 1607)  Complications: No apparent anesthesia complications

## 2017-09-21 NOTE — Interval H&P Note (Signed)
History and Physical Interval Note:  09/21/2017 5:31 PM  Edward Booth  has presented today for surgery, with the diagnosis of left ureteral calculus  The various methods of treatment have been discussed with the patient and family. After consideration of risks, benefits and other options for treatment, the patient has consented to  Procedure(s): CYSTOSCOPY WITH RETROGRADE PYELOGRAM/URETERAL STENT PLACEMENT (Left) as a surgical intervention .  The patient's history has been reviewed, patient examined, no change in status, stable for surgery.  I have reviewed the patient's chart and labs.  Questions were answered to the patient's satisfaction.     Ray Church, III

## 2017-09-21 NOTE — Anesthesia Procedure Notes (Signed)
Procedure Name: LMA Insertion Date/Time: 09/21/2017 5:47 PM Performed by: Minerva Ends, CRNA Pre-anesthesia Checklist: Patient identified, Emergency Drugs available, Suction available and Patient being monitored Patient Re-evaluated:Patient Re-evaluated prior to induction Oxygen Delivery Method: Circle System Utilized Preoxygenation: Pre-oxygenation with 100% oxygen Induction Type: IV induction Ventilation: Mask ventilation without difficulty LMA: LMA inserted and LMA with gastric port inserted LMA Size: 4.0 Tube type: Oral (20 cc air) Number of attempts: 1 Airway Equipment and Method: Bite block Placement Confirmation: positive ETCO2 Tube secured with: Tape Dental Injury: Teeth and Oropharynx as per pre-operative assessment  Comments: Smooth IV induction--- LMA inserted AM CRNA-- atraumatic-- teeth and mouth as preop-- many missing teeth and loose teeth --- upper front and lower back-- unchanged after LMA insertion-- bilat BS Rose

## 2017-09-22 ENCOUNTER — Encounter (HOSPITAL_COMMUNITY): Payer: Self-pay | Admitting: Urology

## 2017-09-22 LAB — URINE CULTURE
Culture: NO GROWTH
Special Requests: NORMAL

## 2017-09-22 MED FILL — Oxycodone w/ Acetaminophen Tab 5-325 MG: ORAL | Qty: 6 | Status: AC

## 2017-09-23 NOTE — Anesthesia Postprocedure Evaluation (Signed)
Anesthesia Post Note  Patient: Edward Booth  Procedure(s) Performed: CYSTOSCOPY WITH RETROGRADE PYELOGRAM/URETERAL STENT PLACEMENT (Left Ureter)     Patient location during evaluation: PACU Anesthesia Type: General Level of consciousness: awake and alert Pain management: pain level controlled Vital Signs Assessment: post-procedure vital signs reviewed and stable Respiratory status: spontaneous breathing, nonlabored ventilation, respiratory function stable and patient connected to nasal cannula oxygen Cardiovascular status: blood pressure returned to baseline and stable Postop Assessment: no apparent nausea or vomiting Anesthetic complications: no    Last Vitals:  Vitals:   09/21/17 1915 09/21/17 1935  BP: (!) 150/93 139/88  Pulse: 86 79  Resp: 18 17  Temp: 36.6 C 36.4 C  SpO2: 95% 96%    Last Pain:  Vitals:   09/21/17 1935  TempSrc:   PainSc: 4                  Craige Patel S

## 2017-10-06 ENCOUNTER — Other Ambulatory Visit: Payer: Self-pay | Admitting: Urology

## 2017-10-06 ENCOUNTER — Encounter (HOSPITAL_BASED_OUTPATIENT_CLINIC_OR_DEPARTMENT_OTHER): Payer: Self-pay | Admitting: *Deleted

## 2017-10-06 ENCOUNTER — Other Ambulatory Visit: Payer: Self-pay

## 2017-10-06 NOTE — Progress Notes (Signed)
Spoke w/ pt via phone for pre-op interview.  Npo after mn.  Arrive at [id number].  Current lab results, dated 09-20-2017, in chart and epic.  May take oxycodone am dos w/ sips of water.

## 2017-10-06 NOTE — Anesthesia Preprocedure Evaluation (Addendum)
Anesthesia Evaluation  Patient identified by MRN, date of birth, ID band Patient awake    Reviewed: Allergy & Precautions, NPO status , Patient's Chart, lab work & pertinent test results  Airway Mallampati: III  TM Distance: >3 FB Neck ROM: Full    Dental no notable dental hx. (+) Dental Advisory Given, Missing, Loose,    Pulmonary Current Smoker,    Pulmonary exam normal breath sounds clear to auscultation       Cardiovascular negative cardio ROS Normal cardiovascular exam Rhythm:Regular Rate:Normal     Neuro/Psych  Headaches, negative psych ROS   GI/Hepatic   Endo/Other  Morbid obesity  Renal/GU Renal disease     Musculoskeletal   Abdominal (+) + obese,   Peds  Hematology negative hematology ROS (+)   Anesthesia Other Findings   Reproductive/Obstetrics                            Lab Results  Component Value Date   WBC 9.6 09/20/2017   HGB 15.8 09/20/2017   HCT 45.9 09/20/2017   MCV 91.4 09/20/2017   PLT 246 09/20/2017    Anesthesia Physical Anesthesia Plan  ASA: III  Anesthesia Plan: General   Post-op Pain Management:    Induction: Intravenous  PONV Risk Score and Plan: Treatment may vary due to age or medical condition  Airway Management Planned: LMA  Additional Equipment:   Intra-op Plan:   Post-operative Plan:   Informed Consent: I have reviewed the patients History and Physical, chart, labs and discussed the procedure including the risks, benefits and alternatives for the proposed anesthesia with the patient or authorized representative who has indicated his/her understanding and acceptance.     Plan Discussed with: CRNA  Anesthesia Plan Comments:         Anesthesia Quick Evaluation

## 2017-10-07 ENCOUNTER — Ambulatory Visit (HOSPITAL_BASED_OUTPATIENT_CLINIC_OR_DEPARTMENT_OTHER)
Admission: RE | Admit: 2017-10-07 | Discharge: 2017-10-07 | Disposition: A | Discharge status: Home / Self Care | Payer: Self-pay | Source: Ambulatory Visit | Attending: Urology | Admitting: Urology

## 2017-10-07 ENCOUNTER — Encounter (HOSPITAL_BASED_OUTPATIENT_CLINIC_OR_DEPARTMENT_OTHER)
Admission: RE | Disposition: A | Discharge status: Home / Self Care | Payer: Self-pay | Source: Ambulatory Visit | Attending: Urology

## 2017-10-07 ENCOUNTER — Encounter (HOSPITAL_BASED_OUTPATIENT_CLINIC_OR_DEPARTMENT_OTHER): Payer: Self-pay

## 2017-10-07 ENCOUNTER — Ambulatory Visit (HOSPITAL_BASED_OUTPATIENT_CLINIC_OR_DEPARTMENT_OTHER): Payer: Self-pay | Admitting: Anesthesiology

## 2017-10-07 DIAGNOSIS — F17200 Nicotine dependence, unspecified, uncomplicated: Secondary | ICD-10-CM | POA: Insufficient documentation

## 2017-10-07 DIAGNOSIS — Z6841 Body Mass Index (BMI) 40.0 and over, adult: Secondary | ICD-10-CM | POA: Insufficient documentation

## 2017-10-07 DIAGNOSIS — N132 Hydronephrosis with renal and ureteral calculous obstruction: Secondary | ICD-10-CM | POA: Insufficient documentation

## 2017-10-07 DIAGNOSIS — Z841 Family history of disorders of kidney and ureter: Secondary | ICD-10-CM | POA: Insufficient documentation

## 2017-10-07 DIAGNOSIS — Z9102 Food additives allergy status: Secondary | ICD-10-CM | POA: Insufficient documentation

## 2017-10-07 DIAGNOSIS — Z8249 Family history of ischemic heart disease and other diseases of the circulatory system: Secondary | ICD-10-CM | POA: Insufficient documentation

## 2017-10-07 DIAGNOSIS — Z9889 Other specified postprocedural states: Secondary | ICD-10-CM | POA: Insufficient documentation

## 2017-10-07 HISTORY — DX: Hematuria, unspecified: R31.9

## 2017-10-07 HISTORY — DX: Urgency of urination: R39.15

## 2017-10-07 HISTORY — DX: Dysuria: R30.0

## 2017-10-07 HISTORY — PX: CYSTOSCOPY WITH RETROGRADE PYELOGRAM, URETEROSCOPY AND STENT PLACEMENT: SHX5789

## 2017-10-07 HISTORY — DX: Calculus of kidney: N20.0

## 2017-10-07 HISTORY — PX: HOLMIUM LASER APPLICATION: SHX5852

## 2017-10-07 HISTORY — DX: Calculus of ureter: N20.1

## 2017-10-07 HISTORY — DX: Family history of other specified conditions: Z84.89

## 2017-10-07 SURGERY — CYSTOURETEROSCOPY, WITH RETROGRADE PYELOGRAM AND STENT INSERTION
Anesthesia: General | Site: Renal | Laterality: Left

## 2017-10-07 MED ORDER — LIDOCAINE 2% (20 MG/ML) 5 ML SYRINGE
INTRAMUSCULAR | Status: AC
  Filled 2017-10-07: qty 5

## 2017-10-07 MED ORDER — CEFAZOLIN SODIUM-DEXTROSE 2-4 GM/100ML-% IV SOLN
INTRAVENOUS | Status: AC
  Filled 2017-10-07: qty 100

## 2017-10-07 MED ORDER — FENTANYL CITRATE (PF) 100 MCG/2ML IJ SOLN
INTRAMUSCULAR | Status: AC
  Filled 2017-10-07: qty 2

## 2017-10-07 MED ORDER — LACTATED RINGERS IV SOLN
INTRAVENOUS | Status: DC
  Administered 2017-10-07 (×2): via INTRAVENOUS
  Filled 2017-10-07: qty 1000

## 2017-10-07 MED ORDER — DEXTROSE 5 % IV SOLN
3.0000 g | INTRAVENOUS | Status: AC
  Administered 2017-10-07: 3 g via INTRAVENOUS
  Filled 2017-10-07: qty 3000

## 2017-10-07 MED ORDER — HYDROMORPHONE HCL 1 MG/ML IJ SOLN
0.2500 mg | INTRAMUSCULAR | Status: DC | PRN
  Filled 2017-10-07: qty 0.5

## 2017-10-07 MED ORDER — CEFAZOLIN SODIUM-DEXTROSE 1-4 GM/50ML-% IV SOLN
INTRAVENOUS | Status: AC
Start: 2017-10-07 — End: ?
  Filled 2017-10-07: qty 50

## 2017-10-07 MED ORDER — DEXAMETHASONE SODIUM PHOSPHATE 10 MG/ML IJ SOLN
INTRAMUSCULAR | Status: AC
Start: 2017-10-07 — End: ?
  Filled 2017-10-07: qty 1

## 2017-10-07 MED ORDER — ACETAMINOPHEN 500 MG PO TABS
1000.0000 mg | ORAL_TABLET | Freq: Once | ORAL | Status: AC
  Administered 2017-10-07: 1000 mg via ORAL
  Filled 2017-10-07: qty 2

## 2017-10-07 MED ORDER — LIDOCAINE 2% (20 MG/ML) 5 ML SYRINGE
INTRAMUSCULAR | Status: DC | PRN
  Administered 2017-10-07: 100 mg via INTRAVENOUS

## 2017-10-07 MED ORDER — OXYCODONE HCL 5 MG PO TABS
5.0000 mg | ORAL_TABLET | Freq: Once | ORAL | Status: AC
  Administered 2017-10-07: 5 mg via ORAL
  Filled 2017-10-07: qty 1

## 2017-10-07 MED ORDER — ONDANSETRON HCL 4 MG/2ML IJ SOLN
INTRAMUSCULAR | Status: AC
  Filled 2017-10-07: qty 2

## 2017-10-07 MED ORDER — IOHEXOL 300 MG/ML  SOLN
INTRAMUSCULAR | Status: DC | PRN
  Administered 2017-10-07: 10 mL

## 2017-10-07 MED ORDER — HYDROCODONE-ACETAMINOPHEN 7.5-325 MG PO TABS
1.0000 | ORAL_TABLET | Freq: Once | ORAL | Status: DC | PRN
  Filled 2017-10-07: qty 1

## 2017-10-07 MED ORDER — MIDAZOLAM HCL 5 MG/5ML IJ SOLN
INTRAMUSCULAR | Status: DC | PRN
  Administered 2017-10-07: 2 mg via INTRAVENOUS

## 2017-10-07 MED ORDER — ACETAMINOPHEN 10 MG/ML IV SOLN
1000.0000 mg | Freq: Once | INTRAVENOUS | Status: DC | PRN
  Filled 2017-10-07: qty 100

## 2017-10-07 MED ORDER — OXYBUTYNIN CHLORIDE 5 MG PO TABS
5.0000 mg | ORAL_TABLET | Freq: Three times a day (TID) | ORAL | Status: DC | PRN
  Administered 2017-10-07: 5 mg via ORAL
  Filled 2017-10-07: qty 1

## 2017-10-07 MED ORDER — MIDAZOLAM HCL 2 MG/2ML IJ SOLN
INTRAMUSCULAR | Status: AC
  Filled 2017-10-07: qty 2

## 2017-10-07 MED ORDER — KETOROLAC TROMETHAMINE 30 MG/ML IJ SOLN
INTRAMUSCULAR | Status: AC
  Filled 2017-10-07: qty 1

## 2017-10-07 MED ORDER — OXYBUTYNIN CHLORIDE 5 MG PO TABS
ORAL_TABLET | ORAL | Status: AC
  Filled 2017-10-07: qty 1

## 2017-10-07 MED ORDER — DEXAMETHASONE SODIUM PHOSPHATE 4 MG/ML IJ SOLN
INTRAMUSCULAR | Status: DC | PRN
  Administered 2017-10-07: 10 mg via INTRAVENOUS

## 2017-10-07 MED ORDER — TAMSULOSIN HCL 0.4 MG PO CAPS: 0.4000 mg | ORAL_CAPSULE | Freq: Every day | ORAL | 3 refills | Status: DC

## 2017-10-07 MED ORDER — TAMSULOSIN HCL 0.4 MG PO CAPS
0.4000 mg | ORAL_CAPSULE | Freq: Every day | ORAL | Status: DC
  Filled 2017-10-07: qty 1

## 2017-10-07 MED ORDER — OXYCODONE-ACETAMINOPHEN 5-325 MG PO TABS: 1.0000 | ORAL_TABLET | ORAL | 0 refills | Status: DC | PRN

## 2017-10-07 MED ORDER — PROPOFOL 10 MG/ML IV BOLUS
INTRAVENOUS | Status: DC | PRN
  Administered 2017-10-07: 50 mg via INTRAVENOUS
  Administered 2017-10-07: 300 mg via INTRAVENOUS

## 2017-10-07 MED ORDER — CEFAZOLIN SODIUM-DEXTROSE 2-4 GM/100ML-% IV SOLN
2.0000 g | INTRAVENOUS | Status: DC
  Filled 2017-10-07: qty 100

## 2017-10-07 MED ORDER — PROMETHAZINE HCL 25 MG/ML IJ SOLN
6.2500 mg | INTRAMUSCULAR | Status: DC | PRN
  Filled 2017-10-07: qty 1

## 2017-10-07 MED ORDER — GABAPENTIN 300 MG PO CAPS
ORAL_CAPSULE | ORAL | Status: AC
  Filled 2017-10-07: qty 1

## 2017-10-07 MED ORDER — PROPOFOL 10 MG/ML IV BOLUS
INTRAVENOUS | Status: AC
  Filled 2017-10-07: qty 20

## 2017-10-07 MED ORDER — ACETAMINOPHEN 500 MG PO TABS
ORAL_TABLET | ORAL | Status: AC
  Filled 2017-10-07: qty 2

## 2017-10-07 MED ORDER — OXYBUTYNIN CHLORIDE 5 MG PO TABS: 5.0000 mg | ORAL_TABLET | Freq: Three times a day (TID) | ORAL | 3 refills | Status: DC | PRN

## 2017-10-07 MED ORDER — OXYCODONE HCL 5 MG PO TABS
ORAL_TABLET | ORAL | Status: AC
  Filled 2017-10-07: qty 1

## 2017-10-07 MED ORDER — ONDANSETRON HCL 4 MG/2ML IJ SOLN
INTRAMUSCULAR | Status: DC | PRN
  Administered 2017-10-07: 4 mg via INTRAVENOUS

## 2017-10-07 MED ORDER — KETOROLAC TROMETHAMINE 30 MG/ML IJ SOLN
INTRAMUSCULAR | Status: DC | PRN
  Administered 2017-10-07: 30 mg via INTRAVENOUS

## 2017-10-07 MED ORDER — FENTANYL CITRATE (PF) 100 MCG/2ML IJ SOLN
INTRAMUSCULAR | Status: DC | PRN
  Administered 2017-10-07 (×4): 50 ug via INTRAVENOUS

## 2017-10-07 MED ORDER — GABAPENTIN 300 MG PO CAPS
300.0000 mg | ORAL_CAPSULE | Freq: Once | ORAL | Status: AC
  Administered 2017-10-07: 300 mg via ORAL
  Filled 2017-10-07: qty 1

## 2017-10-07 MED ORDER — SODIUM CHLORIDE 0.9 % IR SOLN
Status: DC | PRN
  Administered 2017-10-07: 3000 mL

## 2017-10-07 MED ORDER — MEPERIDINE HCL 25 MG/ML IJ SOLN
6.2500 mg | INTRAMUSCULAR | Status: DC | PRN
  Filled 2017-10-07: qty 1

## 2017-10-07 SURGICAL SUPPLY — 26 items
BAG DRAIN URO-CYSTO SKYTR STRL (DRAIN) ×3 IMPLANT
BASKET ZERO TIP NITINOL 2.4FR (BASKET) ×3 IMPLANT
CATH INTERMIT  6FR 70CM (CATHETERS) ×3 IMPLANT
CLOTH BEACON ORANGE TIMEOUT ST (SAFETY) ×3 IMPLANT
FIBER LASER FLEXIVA 365 (UROLOGICAL SUPPLIES) IMPLANT
FIBER LASER TRAC TIP (UROLOGICAL SUPPLIES) ×3 IMPLANT
GLOVE BIO SURGEON STRL SZ7.5 (GLOVE) ×3 IMPLANT
GLOVE BIOGEL PI IND STRL 6.5 (GLOVE) ×1 IMPLANT
GLOVE BIOGEL PI INDICATOR 6.5 (GLOVE) ×2
GLOVE SURG SS PI 6.5 STRL IVOR (GLOVE) ×3 IMPLANT
GOWN STRL REUS W/TWL LRG LVL3 (GOWN DISPOSABLE) ×3 IMPLANT
GOWN STRL REUS W/TWL XL LVL3 (GOWN DISPOSABLE) ×3 IMPLANT
GUIDEWIRE STR DUAL SENSOR (WIRE) ×6 IMPLANT
INFUSOR MANOMETER BAG 3000ML (MISCELLANEOUS) IMPLANT
IV NS 1000ML (IV SOLUTION)
IV NS 1000ML BAXH (IV SOLUTION) IMPLANT
IV NS IRRIG 3000ML ARTHROMATIC (IV SOLUTION) ×3 IMPLANT
KIT TURNOVER CYSTO (KITS) ×3 IMPLANT
MANIFOLD NEPTUNE II (INSTRUMENTS) ×3 IMPLANT
NS IRRIG 500ML POUR BTL (IV SOLUTION) ×3 IMPLANT
PACK CYSTO (CUSTOM PROCEDURE TRAY) ×3 IMPLANT
SHEATH URET ACCESS 12FR/35CM (UROLOGICAL SUPPLIES) ×3 IMPLANT
STENT URET 6FRX26 CONTOUR (STENTS) ×3 IMPLANT
TUBE CONNECTING 12'X1/4 (SUCTIONS)
TUBE CONNECTING 12X1/4 (SUCTIONS) IMPLANT
TUBING UROLOGY SET (TUBING) ×3 IMPLANT

## 2017-10-07 NOTE — Anesthesia Procedure Notes (Signed)
Procedure Name: LMA Insertion Date/Time: 10/07/2017 10:11 AM Performed by: Trevor IhaHouser, Stephen A, MD Pre-anesthesia Checklist: Patient identified, Emergency Drugs available, Suction available and Patient being monitored Patient Re-evaluated:Patient Re-evaluated prior to induction Oxygen Delivery Method: Circle system utilized Preoxygenation: Pre-oxygenation with 100% oxygen Induction Type: IV induction Ventilation: Mask ventilation without difficulty LMA: LMA inserted LMA Size: 4.0 Number of attempts: 1 Airway Equipment and Method: Bite block Placement Confirmation: positive ETCO2 Tube secured with: Tape Dental Injury: Teeth and Oropharynx as per pre-operative assessment  Comments: 20 ml of air in cuff, good seal.

## 2017-10-07 NOTE — Op Note (Signed)
Operative Note  Preoperative diagnosis:  1.  Left ureteral calculus  Postoperative diagnosis: 1.  Left ureteral calculus  Procedure(s): 1.  Cystoscopy with left retrograde pyelogram with interpretation, left ureteroscopy with laser lithotripsy and stone basketing, left ureteral stent replacement, fluoroscopy less than 1 hour  Surgeon: Modena SlaterEugene Peighton Edgin, MD  Assistants: None  Anesthesia: General  Complications: None immediate  EBL: Minimal  Specimens: 1.  None  Drains/Catheters: 1.  6 x 26 double-J ureteral stent  Intraoperative findings: 1.  Normal urethra and bladder 2.  Left retrograde pyelogram revealed some hydronephrosis but no filling defect in the kidney 3.  5 mm proximal ureteral calculus fragmented and extracted  Indication: [id number] year old male who underwent an urgent ureteral stent placement for a ureteral calculus with a fever presents for definitive management of his stone.  Description of procedure:  The patient was identified and consent was obtained.  The patient was taken to the operating room and placed in the supine position.  The patient was placed under general anesthesia.  Perioperative antibiotics were administered.  The patient was placed in dorsal lithotomy.  Patient was prepped and draped in a standard sterile fashion and a timeout was performed.  A 21 French rigid cystoscope was advanced into the urethra and into the bladder.  Cystoscopy was performed.  The previously placed stent was grasped and pulled just beyond the urethral meatus.  A sensor wire was advanced through the stent and up to the kidney under fluoroscopic guidance and the stent was removed.  A semirigid ureteroscope was advanced alongside the wire but could not quite reach the proximal stone of interest.  I placed a second sensor wire through the ureteroscope and into the kidney.  I withdrew the ureteroscope and advanced a 12 x 14 ureteral access sheath over 1 of the wires under continuous  fluoroscopic guidance.  The inner sheath along with wire were withdrawn flexible ureteroscopy was performed and identified the stone of interest.  This was laser fragmented to smaller fragments and then basket extracted.  There were no other ureteral calculi seen.  There was ureteral edema around the level of the stone but no injury was noted.  No other ureteral calculi were seen.  There were no obvious stones within the kidney.  I shot a retrograde pyelogram through the scope with the findings noted above.  I then withdrew the scope along with the access sheath and no ureteral injury or ureteral calculi were seen.  I backloaded the wire onto a cystoscope which was advanced into the bladder.  I then placed a 6 x 26 double-J ureteral stent in a standard fashion followed by removal of the wire.  Fluoroscopy confirmed proximal placement and direct visualization confirmed a good coil within the bladder.  I drained the bladder and withdrew the scope.  This concluded the operation.  Patient tolerated procedure well and was stable postoperatively.  Plan: Return in 1 week for ureteral stent removal.

## 2017-10-07 NOTE — Anesthesia Postprocedure Evaluation (Signed)
Anesthesia Post Note  Patient: Edward Booth  Procedure(s) Performed: CYSTOSCOPY WITH LEFT  RETROGRADE PYELOGRAM, URETEROSCOPY ,LITHROTRIPSY.  AND STENT REPLACEMENT (Left Renal) HOLMIUM LASER APPLICATION (Left Renal)     Patient location during evaluation: PACU Anesthesia Type: General Level of consciousness: awake and alert Pain management: pain level controlled Vital Signs Assessment: post-procedure vital signs reviewed and stable Respiratory status: spontaneous breathing, nonlabored ventilation, respiratory function stable and patient connected to nasal cannula oxygen Cardiovascular status: blood pressure returned to baseline and stable Postop Assessment: no apparent nausea or vomiting Anesthetic complications: no    Last Vitals:  Vitals:   10/07/17 1115 10/07/17 1130  BP: (!) 141/94 (!) 135/98  Pulse: 86 78  Resp: 13 14  Temp:    SpO2: 94% 94%    Last Pain:  Vitals:   10/07/17 1130  TempSrc:   PainSc: 5                  Trevor IhaStephen A Merdis Snodgrass

## 2017-10-07 NOTE — Discharge Instructions (Addendum)
[facility] °[phone] °Post Ureteroscopy With Stent Instructions ° °Definitions: ° °Ureter: The duct that transports urine from the kidney to the bladder. °Stent:   A plastic hollow tube that is placed into the ureter, from the kidney to the bladder to prevent the ureter from swelling shut. ° °GENERAL INSTRUCTIONS: ° °Despite the fact that no skin incisions were used, the area around the ureter and bladder is raw and irritated. The stent is a foreign body which will further irritate the bladder wall. This irritation is manifested by increased frequency of urination, both day and night, and by an increase in the urge to urinate. In some, the urge to urinate is present almost always. Sometimes the urge is strong enough that you may not be able to stop yourself from urinating. The only real cure is to remove the stent and then give time for the bladder wall to heal which can't be done until the danger of the ureter swelling shut has passed, which varies. ° °You may see some blood in your urine while the stent is in place and a few days afterwards. Do not be alarmed, even if the urine was clear for a while. Get off your feet and drink lots of fluids until clearing occurs. If you start to pass clots or don't improve, call us. ° °DIET: °You may return to your normal diet immediately. Because of the raw surface of your bladder, alcohol, spicy foods, acid type foods and drinks with caffeine may cause irritation or frequency and should be used in moderation. To keep your urine flowing freely and to avoid constipation, drink plenty of fluids during the day ( 8-10 glasses ). °Tip: Avoid cranberry juice because it is very acidic. ° °ACTIVITY: °Your physical activity doesn't need to be restricted. However, if you are very active, you may see some blood in your urine. We suggest that you reduce your activity under these circumstances until the bleeding has stopped. ° °BOWELS: °It is important to keep your  bowels regular during the postoperative period. Straining with bowel movements can cause bleeding. A bowel movement every other day is reasonable. Use a mild laxative if needed, such as Milk of Magnesia 2-3 tablespoons, or 2 Dulcolax tablets. Call if you continue to have problems. If you have been taking narcotics for pain, before, during or after your surgery, you may be constipated. Take a laxative if necessary. ° ° °MEDICATION: °You should resume your pre-surgery medications unless told not to. °You may take oxybutynin or flomax if prescribed for bladder spasms or discomfort from the stent °Take pain medication as directed for pain refractory to conservative management ° °PROBLEMS YOU SHOULD REPORT TO US: °· Fevers over 100.5 Fahrenheit. °· Heavy bleeding, or clots ( See above notes about blood in urine ). °· Inability to urinate. °· Drug reactions ( hives, rash, nausea, vomiting, diarrhea ). °· Severe burning or pain with urination that is not improving. ° ° ° °Post Anesthesia Home Care Instructions ° °Activity: °Get plenty of rest for the remainder of the day. A responsible individual must stay with you for 24 hours following the procedure.  °For the next 24 hours, DO NOT: °-Drive a car °-Operate machinery °-Drink alcoholic beverages °-Take any medication unless instructed by your physician °-Make any legal decisions or sign important papers. ° °Meals: °Start with liquid foods such as gelatin or soup. Progress to regular foods as tolerated. Avoid greasy, spicy, heavy foods. If nausea and/or vomiting occur, drink only clear liquids until   the nausea and/or vomiting subsides. Call your physician if vomiting continues. ° °Special Instructions/Symptoms: °Your throat may feel dry or sore from the anesthesia or the breathing tube placed in your throat during surgery. If this causes discomfort, gargle with warm salt water. The discomfort should disappear within 24 hours. ° °If you had a scopolamine patch placed behind  your ear for the management of post- operative nausea and/or vomiting: ° °1. The medication in the patch is effective for 72 hours, after which it should be removed.  Wrap patch in a tissue and discard in the trash. Wash hands thoroughly with soap and water. °2. You may remove the patch earlier than 72 hours if you experience unpleasant side effects which may include dry mouth, dizziness or visual disturbances. °3. Avoid touching the patch. Wash your hands with soap and water after contact with the patch. °   ° °

## 2017-10-07 NOTE — Transfer of Care (Signed)
   Last Vitals:  Vitals Value Taken Time  BP 151/102 10/07/2017 11:03 AM  Temp 36.6 C 10/07/2017 11:02 AM  Pulse 92 10/07/2017 11:09 AM  Resp 13 10/07/2017 11:09 AM  SpO2 95 % 10/07/2017 11:09 AM  Vitals shown include unvalidated device data.  Last Pain:  Vitals:   10/07/17 1102  TempSrc:   PainSc: 4       Patients Stated Pain Goal: 5 (10/07/17 0946)  Immediate Anesthesia Transfer of Care Note  Patient: Telford NabBradley J Bram  Procedure(s) Performed: Procedure(s) (LRB): CYSTOSCOPY WITH LEFT  RETROGRADE PYELOGRAM, URETEROSCOPY ,LITHROTRIPSY.  AND STENT REPLACEMENT (Left) HOLMIUM LASER APPLICATION (Left)  Patient Location: PACU  Anesthesia Type: General  Level of Consciousness: awake, alert  and oriented  Airway & Oxygen Therapy: Patient Spontanous Breathing and Patient connected to nasal cannulaoxygen  Post-op Assessment: Report given to PACU RN and Post -op Vital signs reviewed and stable  Post vital signs: Reviewed and stable  Complications: No apparent anesthesia complications

## 2017-10-07 NOTE — H&P (Signed)
CC: I have kidney stones.(Surgery)  HPI: Edward Booth is a 37 year-old male established patient who is here for renal calculi after a surgical intervention.  The problem is on the left side. He had stent for treatment of his renal calculi. Patient denies ureteroscopy, eswl, and percutaneous lithotomy. This procedure was done 09/21/2017. He did not pass stone fragments. This is his first kidney stone. He does have a stent in place.   He is currently having back pain. He denies having flank pain, groin pain, nausea, vomiting, fever, and chills.   He does not have dysuria. He does have urgency. He does have frequency.   Patient underwent an urgent ureteral stent placement for a ureteral calculus with associated fever. He has a stent. He presents for discussion of next steps.   ALLERGIES: aspertame   MEDICATIONS: Aleve 1 PO Daily    GU PSH: Cystoscopy Insert Stent, Left - 09/21/2017   NON-GU PSH: Hernia Repair, Left   GU PMH: Renal and ureteral calculus - 09/21/2017 Renal calculus   NON-GU PMH: Fever, unspecified - 09/21/2017 Gout   FAMILY HISTORY: Diabetes - Mother Heart Attack - Father Heart Disease - Mother, Father Kidney Failure - Mother Tuberculosis - Grandfather   SOCIAL HISTORY: Marital Status: Married Preferred Language: English; Race: White Current Smoking Status: Patient smokes.   Tobacco Use Assessment Completed:  Used Tobacco in last 30 days?  Drinks 1 caffeinated drink per day.   REVIEW OF SYSTEMS:    GU Review Male:   Patient reports frequent urination, hard to postpone urination, and burning/ pain with urination. Patient denies get up at night to urinate, leakage of urine, stream starts and stops, trouble starting your stream, have to strain to urinate , erection problems, and penile pain.  Gastrointestinal (Upper):   Patient denies nausea, vomiting, and indigestion/ heartburn.  Gastrointestinal (Lower):   Patient denies diarrhea and constipation.  Constitutional:    Patient denies fever, night sweats, weight loss, and fatigue.  Skin:   Patient denies skin rash/ lesion and itching.  Eyes:   Patient denies blurred vision and double vision.  Ears/ Nose/ Throat:   Patient denies sore throat and sinus problems.  Hematologic/Lymphatic:   Patient denies swollen glands and easy bruising.  Cardiovascular:   Patient denies leg swelling and chest pains.  Respiratory:   Patient denies cough and shortness of breath.  Endocrine:   Patient denies excessive thirst.  Musculoskeletal:   Patient reports back pain. Patient denies joint pain.  Neurological:   Patient denies headaches and dizziness.  Psychologic:   Patient denies depression and anxiety.   VITAL SIGNS:      09/30/2017 01:20 PM  Weight 325 lb / 147.42 kg  Height 71 in / 180.34 cm  BP 142/92 mmHg  Pulse 92 /min  Temperature 99.1 F / 37.2 C  BMI 45.3 kg/m   MULTI-SYSTEM PHYSICAL EXAMINATION:    Constitutional: Well-nourished. No physical deformities. Normally developed. Good grooming.  Respiratory: No labored breathing, no use of accessory muscles.   Cardiovascular: Normal temperature, adequate perfusion of extremities  Skin: No paleness, no jaundice  Neurologic / Psychiatric: Oriented to time, oriented to place, oriented to person. No depression, no anxiety, no agitation.  Gastrointestinal: No mass, no tenderness, no rigidity, obese abdomen.   Eyes: Normal conjunctivae. Normal eyelids.  Musculoskeletal: Normal gait and station of head and neck.    PAST DATA REVIEWED:  Source Of History:  Patient  X-Ray Review: C.T. Abdomen/Pelvis: Reviewed Films. Reviewed Report. Discussed With  Patient.    PROCEDURES:         Urinalysis w/Scope Dipstick Dipstick Cont'd Micro  Color: Red Bilirubin: Invalid WBC/hpf: 6 - 10/hpf  Appearance: Turbid Ketones: Invalid RBC/hpf: Packed/hpf  Specific Gravity: 1.025 Blood: Invalid Bacteria: Few (10-25/hpf)  pH: Invalid Protein: Invalid Cystals: NS (Not Seen)  Glucose:  Invalid Urobilinogen: Invalid Casts: NS (Not Seen)    Nitrites: Invalid Trichomonas: Not Present    Leukocyte Esterase: Invalid Mucous: Not Present      Epithelial Cells: NS (Not Seen)      Yeast: NS (Not Seen)      Sperm: Not Present   Notes: BIOCHEMICALS REMOVED DUE TO COLOR INTERFERENCE   ASSESSMENT:      ICD-10 Details  1 GU:   Ureteral calculus - N20.1    PLAN:          Orders Labs Urine Culture         Schedule Return Visit/Planned Activity: Next Available Appointment - Schedule Surgery         Document Letter(s):  Created for Patient: Clinical Summary        Notes:   Proceed with left ureteroscopy with laser lithotripsy and ureteral stent exchange. He understands potential risks including but not limited to bleeding, infection, injury to surrounding structures including the rare but potential risk of ureteral transection, need for additional procedures.   Signed by Modena Slater, III, M.D. on 09/30/17 at 1:30 PM (EDT

## 2017-10-08 ENCOUNTER — Encounter (HOSPITAL_BASED_OUTPATIENT_CLINIC_OR_DEPARTMENT_OTHER): Payer: Self-pay | Admitting: Urology

## 2018-01-08 ENCOUNTER — Encounter (HOSPITAL_COMMUNITY): Payer: Self-pay

## 2018-01-08 ENCOUNTER — Other Ambulatory Visit: Payer: Self-pay

## 2018-01-08 ENCOUNTER — Emergency Department (HOSPITAL_COMMUNITY)
Admission: EM | Admit: 2018-01-08 | Discharge: 2018-01-08 | Disposition: A | Discharge status: Home / Self Care | Payer: Self-pay | Attending: Emergency Medicine | Admitting: Emergency Medicine

## 2018-01-08 DIAGNOSIS — Z79899 Other long term (current) drug therapy: Secondary | ICD-10-CM | POA: Insufficient documentation

## 2018-01-08 DIAGNOSIS — F17210 Nicotine dependence, cigarettes, uncomplicated: Secondary | ICD-10-CM | POA: Insufficient documentation

## 2018-01-08 DIAGNOSIS — M109 Gout, unspecified: Secondary | ICD-10-CM | POA: Insufficient documentation

## 2018-01-08 MED ORDER — COLCHICINE 0.6 MG PO TABS
1.2000 mg | ORAL_TABLET | Freq: Once | ORAL | Status: AC
  Administered 2018-01-08: 1.2 mg via ORAL
  Filled 2018-01-08: qty 2

## 2018-01-08 MED ORDER — OXYCODONE-ACETAMINOPHEN 5-325 MG PO TABS: 1.0000 | ORAL_TABLET | ORAL | 0 refills | Status: DC | PRN

## 2018-01-08 MED ORDER — COLCHICINE 0.6 MG PO TABS: ORAL_TABLET | ORAL | 0 refills | Status: DC

## 2018-01-08 MED ORDER — OXYCODONE-ACETAMINOPHEN 5-325 MG PO TABS
1.0000 | ORAL_TABLET | Freq: Once | ORAL | Status: AC
  Administered 2018-01-08: 1 via ORAL
  Filled 2018-01-08: qty 1

## 2018-01-08 NOTE — ED Triage Notes (Signed)
Pt reports pain in left great toe off and x 1 week.  Reports history of gout.

## 2018-01-08 NOTE — ED Notes (Signed)
Pt report a history of gout Works in [address], and his physician is there  Lives in [place] Reports 7-10 days hx of pain to great L toe and now pain into his back

## 2018-01-08 NOTE — ED Notes (Signed)
Pt unable to sign as another in chart

## 2018-01-08 NOTE — ED Provider Notes (Signed)
[facility] EMERGENCY DEPARTMENT Provider Note   CSN: [id number] Arrival date & time: 01/08/18  1010     History   Chief Complaint Chief Complaint  Patient presents with  . Toe Pain    HPI Edward Booth is a 37 y.o. male.  HPI   Edward Booth is a 37 y.o. 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  He states his symptoms initially began last week then gradually resolved and returned again 2 days ago.  At this time pain has progressed and is worse with attempted movement of his great toe and weightbearing.  He also reports having redness and swelling to the first joint of his great toe.  He states the pain feels similar to previous gout flares.  He has taken colchicine in the past with resolution of his symptoms.  He denies known exacerbating factors.  He denies pain, swelling, or redness proximal to his toe.  He denies symptoms to the distal toe or nail.  No known injury  Past Medical History:  Diagnosis Date  . Dysuria   . Family history of adverse reaction to anesthesia    mother-- ponv  . Headache   . Hematuria   . Left ureteral stone   . Nephrolithiasis    bilateral non-obstructive per CT 09-10-2017  . Urgency of urination     There are no active problems to display for this patient.   Past Surgical History:  Procedure Laterality Date  . CYSTOSCOPY W/ URETERAL STENT PLACEMENT Left 09/21/2017   Procedure: CYSTOSCOPY WITH RETROGRADE PYELOGRAM/URETERAL STENT PLACEMENT;  Surgeon: Crista Elliot, MD;  Location: [facility];  Service: Urology;  Laterality: Left;  . CYSTOSCOPY WITH RETROGRADE PYELOGRAM, URETEROSCOPY AND STENT PLACEMENT Left 10/07/2017   Procedure: CYSTOSCOPY WITH LEFT  RETROGRADE PYELOGRAM, URETEROSCOPY ,LITHROTRIPSY.  AND STENT REPLACEMENT;  Surgeon: Crista Elliot, MD;  Location: [facility];  Service: Urology;  Laterality:  Left;  . HOLMIUM LASER APPLICATION Left 10/07/2017   Procedure: HOLMIUM LASER APPLICATION;  Surgeon: Crista Elliot, MD;  Location: [facility];  Service: Urology;  Laterality: Left;  . INGUINAL HERNIA REPAIR Left 1986        Home Medications    Prior to Admission medications   Medication Sig Start Date End Date Taking? Authorizing Provider  colchicine 0.6 MG tablet One tablet po q 2 hrs until pain relief or stomach upset. 01/08/18   Arali Somera, PA-C  naproxen sodium (ALEVE) 220 MG tablet Take 220 mg by mouth as needed (headache).    [provider]  oxybutynin (DITROPAN) 5 MG tablet Take 1 tablet (5 mg total) by mouth every 8 (eight) hours as needed for bladder spasms. 10/07/17   Crista Elliot, MD  oxyCODONE-acetaminophen (PERCOCET/ROXICET) 5-325 MG tablet Take 1 tablet by mouth every 4 (four) hours as needed. 01/08/18   Lestine Rahe, PA-C  tamsulosin (FLOMAX) 0.4 MG CAPS capsule Take 1 capsule (0.4 mg total) by mouth daily. Patient taking differently: Take 0.4 mg by mouth daily after supper.  09/21/17   Crista Elliot, MD  tamsulosin (FLOMAX) 0.4 MG CAPS capsule Take 1 capsule (0.4 mg total) by mouth daily. 10/07/17   Crista Elliot, MD    Family History History reviewed. No pertinent family history.  Social History Social History   Tobacco Use  .  Smoking status: Current Every Day Smoker    Packs/day: 1.00    Years: 20.00    Pack years: 20.00    Types: Cigarettes  . Smokeless tobacco: Never Used  Substance Use Topics  . Alcohol use: Yes    Comment: occasionally  . Drug use: Never     Allergies   Aspartame and phenylalanine   Review of Systems Review of Systems  Constitutional: Negative for chills and fever.  Respiratory: Negative for shortness of breath.   Cardiovascular: Negative for chest pain.  Gastrointestinal: Negative for nausea and vomiting.  Musculoskeletal: Positive for arthralgias (Left great toe pain) and joint  swelling.  Skin: Negative for color change and wound.  Neurological: Negative for weakness and numbness.     Physical Exam Updated Vital Signs BP (!) 149/94 (BP Location: Right Arm)   Pulse 86   Temp 97.9 F (36.6 C) (Oral)   Resp 20   Ht 5\' 11"  (1.803 m)   Wt (!) 152 kg   SpO2 100%   BMI 46.72 kg/m   Physical Exam  Constitutional: He appears well-developed and well-nourished. No distress.  HENT:  Mouth/Throat: Oropharynx is clear and moist.  Cardiovascular: Normal rate, regular rhythm and intact distal pulses.  No murmur heard. Pulmonary/Chest: Effort normal and breath sounds normal. No respiratory distress.  Musculoskeletal: Normal range of motion. He exhibits edema and tenderness.  Focal tenderness to palpation of the proximal left great toe.  Mild erythema and edema noted.  Distal toe and nail are nontender.  Ankle nontender.  Neurological: He is alert. No sensory deficit.  Skin: Skin is warm. Capillary refill takes less than 2 seconds.  Nursing note and vitals reviewed.    ED Treatments / Results  Labs (all labs ordered are listed, but only abnormal results are displayed) Labs Reviewed - No data to display  EKG None  Radiology No results found.  Procedures Procedures (including critical care time)  Medications Ordered in ED Medications  colchicine tablet 1.2 mg (1.2 mg Oral Given 01/08/18 1108)  oxyCODONE-acetaminophen (PERCOCET/ROXICET) 5-325 MG per tablet 1 tablet (1 tablet Oral Given 01/08/18 1108)     Initial Impression / Assessment and Plan / ED Course  I have reviewed the triage vital signs and the nursing notes.  Pertinent labs & imaging results that were available during my care of the patient were reviewed by me and considered in my medical decision making (see chart for details).     Patient with likely recurrent gout flare.  Neurovascularly intact.  No concerning symptoms for infectious process.  Will treat with colchicine and short course of  pain medication.  he appears appropriate for discharge home, he agrees to treatment plan and close PCP follow-up if needed.  Final Clinical Impressions(s) / ED Diagnoses   Final diagnoses:  Acute gout involving toe of left foot, unspecified cause    ED Discharge Orders         Ordered    colchicine 0.6 MG tablet     01/08/18 1108    oxyCODONE-acetaminophen (PERCOCET/ROXICET) 5-325 MG tablet  Every 4 hours PRN     01/08/18 1108           Debara Kamphuis, [place], PA-C 01/08/18 1118    Samuel Jester, DO 01/12/18 [id number]

## 2018-01-08 NOTE — Discharge Instructions (Addendum)
Take the colchicine as directed until your pain subsides.  You should probably only need 1 or 2 more additional tablets.  It may cause stomach upset.  Follow-up with your primary doctor for recheck or return to the ER for any worsening symptoms.

## 2018-06-14 ENCOUNTER — Emergency Department (HOSPITAL_COMMUNITY)
Admission: EM | Admit: 2018-06-14 | Discharge: 2018-06-14 | Disposition: A | Discharge status: Home / Self Care | Payer: BLUE CROSS/BLUE SHIELD | Attending: Emergency Medicine | Admitting: Emergency Medicine

## 2018-06-14 ENCOUNTER — Encounter (HOSPITAL_COMMUNITY): Payer: Self-pay | Admitting: *Deleted

## 2018-06-14 ENCOUNTER — Other Ambulatory Visit: Payer: Self-pay

## 2018-06-14 DIAGNOSIS — F17210 Nicotine dependence, cigarettes, uncomplicated: Secondary | ICD-10-CM | POA: Insufficient documentation

## 2018-06-14 DIAGNOSIS — Z79899 Other long term (current) drug therapy: Secondary | ICD-10-CM | POA: Diagnosis not present

## 2018-06-14 DIAGNOSIS — N5089 Other specified disorders of the male genital organs: Secondary | ICD-10-CM | POA: Insufficient documentation

## 2018-06-14 MED ORDER — DOXYCYCLINE HYCLATE 100 MG PO CAPS: 100.0000 mg | ORAL_CAPSULE | Freq: Two times a day (BID) | ORAL | 0 refills | Status: DC

## 2018-06-14 MED ORDER — DOXYCYCLINE HYCLATE 100 MG PO TABS
100.0000 mg | ORAL_TABLET | Freq: Once | ORAL | Status: AC
  Administered 2018-06-14: 100 mg via ORAL
  Filled 2018-06-14: qty 1

## 2018-06-14 NOTE — Discharge Instructions (Signed)
Return here tomorrow for ultrasound °

## 2018-06-14 NOTE — ED Provider Notes (Signed)
[facility] EMERGENCY DEPARTMENT Provider Note   CSN: [id number] Arrival date & time: 06/14/18  2102     History   Chief Complaint Chief Complaint  Patient presents with  . Abscess    HPI Edward NabBradley J Kludt is a 38 y.o. male.  The history is provided by the patient. No language interpreter was used.  Abscess  Location:  Pelvis Pelvic abscess location:  Scrotum Size:  3 cm Abscess quality: induration   Red streaking: no   Progression:  Worsening Chronicity:  New Relieved by:  Nothing Worsened by:  Nothing Ineffective treatments:  None tried Associated symptoms: no nausea     Past Medical History:  Diagnosis Date  . Dysuria   . Family history of adverse reaction to anesthesia    mother-- ponv  . Headache   . Hematuria   . Left ureteral stone   . Nephrolithiasis    bilateral non-obstructive per CT 09-10-2017  . Urgency of urination     There are no active problems to display for this patient.   Past Surgical History:  Procedure Laterality Date  . CYSTOSCOPY W/ URETERAL STENT PLACEMENT Left 09/21/2017   Procedure: CYSTOSCOPY WITH RETROGRADE PYELOGRAM/URETERAL STENT PLACEMENT;  Surgeon: Crista ElliotBell, Eugene D III, MD;  Location: [facility];  Service: Urology;  Laterality: Left;  . CYSTOSCOPY WITH RETROGRADE PYELOGRAM, URETEROSCOPY AND STENT PLACEMENT Left 10/07/2017   Procedure: CYSTOSCOPY WITH LEFT  RETROGRADE PYELOGRAM, URETEROSCOPY ,LITHROTRIPSY.  AND STENT REPLACEMENT;  Surgeon: Crista ElliotBell, Eugene D III, MD;  [facility];  Service: Urology;  Laterality: Left;  . HOLMIUM LASER APPLICATION Left 10/07/2017   Procedure: HOLMIUM LASER APPLICATION;  Surgeon: Crista ElliotBell, Eugene D III, MD;  [facility];  Service: Urology;  Laterality: Left;  . INGUINAL HERNIA REPAIR Left 1986        Home Medications    Prior to Admission medications   Medication Sig Start Date End Date Taking? Authorizing Provider  colchicine 0.6 MG tablet One tablet po q  2 hrs until pain relief or stomach upset. 01/08/18   Triplett, Tammy, PA-C  doxycycline (VIBRAMYCIN) 100 MG capsule Take 1 capsule (100 mg total) by mouth 2 (two) times daily. 06/14/18   Elson AreasSofia, Zarie Kosiba K, PA-C  naproxen sodium (ALEVE) 220 MG tablet Take 220 mg by mouth as needed (headache).    [provider]  oxybutynin (DITROPAN) 5 MG tablet Take 1 tablet (5 mg total) by mouth every 8 (eight) hours as needed for bladder spasms. 10/07/17   Crista ElliotBell, Eugene D III, MD  oxyCODONE-acetaminophen (PERCOCET/ROXICET) 5-325 MG tablet Take 1 tablet by mouth every 4 (four) hours as needed. 01/08/18   Triplett, Tammy, PA-C  tamsulosin (FLOMAX) 0.4 MG CAPS capsule Take 1 capsule (0.4 mg total) by mouth daily. Patient taking differently: Take 0.4 mg by mouth daily after supper.  09/21/17   Crista ElliotBell, Eugene D III, MD  tamsulosin (FLOMAX) 0.4 MG CAPS capsule Take 1 capsule (0.4 mg total) by mouth daily. 10/07/17   Crista ElliotBell, Eugene D III, MD    Family History No family history on file.  Social History Social History   Tobacco Use  . Smoking status: Current Every Day Smoker    Packs/day: 1.00    Years: 20.00    Pack years: 20.00    Types: Cigarettes  . Smokeless tobacco: Never Used  Substance Use Topics  . Alcohol use: Yes    Comment: occasionally  . Drug use: Never     Allergies  Aspartame and phenylalanine   Review of Systems Review of Systems  Gastrointestinal: Negative for nausea.  All other systems reviewed and are negative.    Physical Exam Updated Vital Signs BP (!) 162/94 (BP Location: Right Arm)   Pulse 81   Temp 98 F (36.7 C) (Oral)   Ht 5\' 11"  (1.803 m)   Wt (!) 154.2 kg   SpO2 94%   BMI 47.42 kg/m   Physical Exam Vitals signs and nursing note reviewed.  Constitutional:      Appearance: He is well-developed.  HENT:     Head: Normocephalic and atraumatic.  Eyes:     Conjunctiva/sclera: Conjunctivae normal.  Neck:     Musculoskeletal: Neck supple.  Cardiovascular:     Rate  and Rhythm: Normal rate and regular rhythm.     Heart sounds: No murmur.  Pulmonary:     Effort: Pulmonary effort is normal. No respiratory distress.     Breath sounds: Normal breath sounds.  Abdominal:     Palpations: Abdomen is soft.     Tenderness: There is no abdominal tenderness.  Genitourinary:    Comments: Right scrotum 2.5 cm swollen area, tender  Musculoskeletal: Normal range of motion.  Skin:    General: Skin is warm and dry.  Neurological:     General: No focal deficit present.     Mental Status: He is alert.      ED Treatments / Results  Labs (all labs ordered are listed, but only abnormal results are displayed) Labs Reviewed - No data to display  EKG None  Radiology No results found.  Procedures Procedures (including critical care time)  Medications Ordered in ED Medications  doxycycline (VIBRA-TABS) tablet 100 mg (100 mg Oral Given 06/14/18 2232)     Initial Impression / Assessment and Plan / ED Course  I have reviewed the triage vital signs and the nursing notes.  Pertinent labs & imaging results that were available during my care of the patient were reviewed by me and considered in my medical decision making (see chart for details).     Ultrasound by Dr. Pilar PlateBero,  ?hydrocele,  Pt started on doxycycline.  Pt to return in am for formal ultrasound,  No evidence of torsion   Final Clinical Impressions(s) / ED Diagnoses   Final diagnoses:  Testicle lump    ED Discharge Orders         Ordered    doxycycline (VIBRAMYCIN) 100 MG capsule  2 times daily     06/14/18 2221    US SCROTUM     06/14/18 2222    US SCROTUM DOPPLER     06/14/18 2225        An After Visit Summary was printed and given to the patient.    Elson AreasSofia, Aanyah Loa K, [place]-C 06/14/18 2356    Sabas SousBero, Michael M, MD 06/15/18 Ivor [id number]

## 2018-06-14 NOTE — ED Triage Notes (Signed)
Pt c/o abscess to scrotum area that started today, denies any drainage,

## 2018-06-14 NOTE — ED Notes (Signed)
Pt ambulated to room in fast track with steady gait

## 2018-06-15 ENCOUNTER — Other Ambulatory Visit (HOSPITAL_COMMUNITY): Payer: Self-pay | Admitting: Physician Assistant

## 2018-06-15 ENCOUNTER — Telehealth (HOSPITAL_COMMUNITY): Payer: Self-pay | Admitting: Emergency Medicine

## 2018-06-15 ENCOUNTER — Ambulatory Visit (HOSPITAL_COMMUNITY)
Admission: RE | Admit: 2018-06-15 | Discharge: 2018-06-15 | Disposition: A | Discharge status: Home / Self Care | Payer: BLUE CROSS/BLUE SHIELD | Source: Ambulatory Visit | Attending: Physician Assistant | Admitting: Physician Assistant

## 2018-06-15 DIAGNOSIS — N5089 Other specified disorders of the male genital organs: Secondary | ICD-10-CM | POA: Insufficient documentation

## 2018-06-15 MED ORDER — SULFAMETHOXAZOLE-TRIMETHOPRIM 800-160 MG PO TABS: 1.0000 | ORAL_TABLET | Freq: Two times a day (BID) | ORAL | 0 refills | Status: DC

## 2018-06-15 MED ORDER — SULFAMETHOXAZOLE-TRIMETHOPRIM 800-160 MG PO TABS: 1.0000 | ORAL_TABLET | Freq: Two times a day (BID) | ORAL | 0 refills | Status: AC

## 2018-06-15 NOTE — Telephone Encounter (Signed)
Patient returned for ultrasound which revealed small scrotal abscess.  Discussed with Dr. Kathrynn Running of urology, and appropriate for close observation with antibiotic coverage.  Will switch from doxycycline to Bactrim per urology recommendations.  Elmer Sow. Pilar Plate, MD [facility] [email or web address]

## 2018-07-22 ENCOUNTER — Encounter (HOSPITAL_COMMUNITY): Payer: Self-pay | Admitting: Emergency Medicine

## 2018-07-22 ENCOUNTER — Emergency Department (HOSPITAL_COMMUNITY): Payer: BLUE CROSS/BLUE SHIELD

## 2018-07-22 ENCOUNTER — Emergency Department (HOSPITAL_COMMUNITY)
Admission: EM | Admit: 2018-07-22 | Discharge: 2018-07-22 | Disposition: A | Discharge status: Home / Self Care | Payer: BLUE CROSS/BLUE SHIELD | Attending: Emergency Medicine | Admitting: Emergency Medicine

## 2018-07-22 ENCOUNTER — Other Ambulatory Visit: Payer: Self-pay

## 2018-07-22 DIAGNOSIS — R109 Unspecified abdominal pain: Secondary | ICD-10-CM

## 2018-07-22 DIAGNOSIS — N201 Calculus of ureter: Secondary | ICD-10-CM

## 2018-07-22 DIAGNOSIS — F17210 Nicotine dependence, cigarettes, uncomplicated: Secondary | ICD-10-CM | POA: Insufficient documentation

## 2018-07-22 LAB — CBC
HCT: 48.1 % (ref 39.0–52.0)
Hemoglobin: 16.3 g/dL (ref 13.0–17.0)
MCH: 30.5 pg (ref 26.0–34.0)
MCHC: 33.9 g/dL (ref 30.0–36.0)
MCV: 90.1 fL (ref 80.0–100.0)
Platelets: 257 10*3/uL (ref 150–400)
RBC: 5.34 MIL/uL (ref 4.22–5.81)
RDW: 13.5 % (ref 11.5–15.5)
WBC: 10.8 10*3/uL — ABNORMAL HIGH (ref 4.0–10.5)
nRBC: 0 % (ref 0.0–0.2)

## 2018-07-22 LAB — URINALYSIS, ROUTINE W REFLEX MICROSCOPIC
Bilirubin Urine: NEGATIVE
Glucose, UA: NEGATIVE mg/dL
KETONES UR: NEGATIVE mg/dL
Leukocytes,Ua: NEGATIVE
Nitrite: NEGATIVE
PH: 6 (ref 5.0–8.0)
Protein, ur: 100 mg/dL — AB
RBC / HPF: >50 RBC/hpf — ABNORMAL HIGH (ref 0–5)
Specific Gravity, Urine: 1.017 (ref 1.005–1.030)

## 2018-07-22 LAB — BASIC METABOLIC PANEL
Anion gap: 7 (ref 5–15)
BUN: 12 mg/dL (ref 6–20)
CO2: 22 mmol/L (ref 22–32)
Calcium: 9 mg/dL (ref 8.9–10.3)
Chloride: 107 mmol/L (ref 98–111)
Creatinine, Ser: 0.84 mg/dL (ref 0.61–1.24)
GFR calc Af Amer: >60 mL/min (ref >60)
GFR calc non Af Amer: >60 mL/min (ref >60)
Glucose, Bld: 98 mg/dL (ref 70–99)
Potassium: 3.7 mmol/L (ref 3.5–5.1)
Sodium: 136 mmol/L (ref 135–145)

## 2018-07-22 MED ORDER — HYDROCODONE-ACETAMINOPHEN 5-325 MG PO TABS: 1.0000 | ORAL_TABLET | Freq: Four times a day (QID) | ORAL | 0 refills | Status: DC | PRN

## 2018-07-22 MED ORDER — HYDROCODONE-ACETAMINOPHEN 5-325 MG PO TABS
1.0000 | ORAL_TABLET | Freq: Once | ORAL | Status: AC
  Administered 2018-07-22: 1 via ORAL
  Filled 2018-07-22: qty 1

## 2018-07-22 NOTE — Discharge Instructions (Signed)
Call urology for follow-up to arrange it tomorrow.  Did not need to see you tomorrow.  Take the hydrocodone as directed.  Prepack provided here tonight.  Return for any new or worse symptoms.  CT scan showed a 3 mm stone in the right ureter.  You will most likely be able to pass this.

## 2018-07-22 NOTE — ED Provider Notes (Signed)
[facility] EMERGENCY DEPARTMENT Provider Note   CSN: [id number] Arrival date & time: 07/22/18  7096    History   Chief Complaint Chief Complaint  Patient presents with   Flank Pain    HPI Edward Booth is a 38 y.o. male.     Patient with onset of right flank pain with blood in his urine starting at about 1 PM this afternoon.  Later in the day had the hematuria no nausea or vomiting.  Patient has a history of kidney stones is followed by Dr. Alvester Morin from [facility] urology.  Has required stents in the past.  Last episode of kidney stones was June 2019.  Patient has no upper respiratory symptoms or any flulike symptoms.  No fevers.  Symptoms do remind him of past kidney stones.     Past Medical History:  Diagnosis Date   Dysuria    Family history of adverse reaction to anesthesia    mother-- ponv   Headache    Hematuria    Left ureteral stone    Nephrolithiasis    bilateral non-obstructive per CT 09-10-2017   Urgency of urination     There are no active problems to display for this patient.   Past Surgical History:  Procedure Laterality Date   CYSTOSCOPY W/ URETERAL STENT PLACEMENT Left 09/21/2017   Procedure: CYSTOSCOPY WITH RETROGRADE PYELOGRAM/URETERAL STENT PLACEMENT;  Surgeon: Crista Elliot, MD;  Location: [facility];  Service: Urology;  Laterality: Left;   CYSTOSCOPY WITH RETROGRADE PYELOGRAM, URETEROSCOPY AND STENT PLACEMENT Left 10/07/2017   Procedure: CYSTOSCOPY WITH LEFT  RETROGRADE PYELOGRAM, URETEROSCOPY ,LITHROTRIPSY.  AND STENT REPLACEMENT;  Surgeon: Crista Elliot, MD;  Location: [facility];  Service: Urology;  Laterality: Left;   HOLMIUM LASER APPLICATION Left 10/07/2017   Procedure: HOLMIUM LASER APPLICATION;  Surgeon: Crista Elliot, MD;  Location: [facility];  Service: Urology;  Laterality: Left;   INGUINAL HERNIA REPAIR Left 1986        Home Medications    Prior to Admission medications   Not on  File    Family History History reviewed. No pertinent family history.  Social History Social History   Tobacco Use   Smoking status: Current Every Day Smoker    Packs/day: 1.00    Years: 20.00    Pack years: 20.00    Types: Cigarettes   Smokeless tobacco: Never Used  Substance Use Topics   Alcohol use: Yes    Comment: occasionally   Drug use: Never     Allergies   Aspartame and phenylalanine   Review of Systems Review of Systems  Constitutional: Negative for chills and fever.  HENT: Negative for congestion, rhinorrhea and sore throat.   Eyes: Negative for visual disturbance.  Respiratory: Negative for cough and shortness of breath.   Cardiovascular: Negative for chest pain and leg swelling.  Gastrointestinal: Negative for abdominal pain, diarrhea, nausea and vomiting.  Genitourinary: Positive for flank pain and hematuria. Negative for dysuria.  Musculoskeletal: Negative for back pain and neck pain.  Skin: Negative for rash.  Neurological: Negative for dizziness, light-headedness and headaches.  Hematological: Does not bruise/bleed easily.  Psychiatric/Behavioral: Negative for confusion.     Physical Exam Updated Vital Signs BP (!) 179/116 (BP Location: Right Arm)    Pulse 87    Temp 97.9 F (36.6 C) (Oral)    Resp 20    Ht 1.803 m (5\' 11" )    Wt (!) 149.7 kg  SpO2 95%    BMI 46.03 kg/m   Physical Exam Vitals signs and nursing note reviewed.  Constitutional:      Appearance: He is well-developed.  HENT:     Head: Normocephalic and atraumatic.     Nose: No congestion.     Mouth/Throat:     Mouth: Mucous membranes are moist.  Eyes:     Extraocular Movements: Extraocular movements intact.     Conjunctiva/sclera: Conjunctivae normal.     Pupils: Pupils are equal, round, and reactive to light.  Neck:     Musculoskeletal: Normal range of motion and neck supple.  Cardiovascular:     Rate and Rhythm: Normal rate and regular rhythm.     Heart sounds:  Normal heart sounds. No murmur.  Pulmonary:     Effort: Pulmonary effort is normal. No respiratory distress.     Breath sounds: Normal breath sounds.  Abdominal:     General: Bowel sounds are normal.     Palpations: Abdomen is soft.     Tenderness: There is no abdominal tenderness.  Musculoskeletal: Normal range of motion.  Skin:    General: Skin is warm and dry.  Neurological:     General: No focal deficit present.     Mental Status: He is alert and oriented to person, place, and time.      ED Treatments / Results  Labs (all labs ordered are listed, but only abnormal results are displayed) Labs Reviewed  CBC - Abnormal; Notable for the following components:      Result Value   WBC 10.8 (*)    All other components within normal limits  URINALYSIS, ROUTINE W REFLEX MICROSCOPIC  BASIC METABOLIC PANEL    EKG None  Radiology Ct Renal Stone Study  Result Date: 07/22/2018 CLINICAL DATA:  Right flank pain with bloody urine EXAM: CT ABDOMEN AND PELVIS WITHOUT CONTRAST TECHNIQUE: Multidetector CT imaging of the abdomen and pelvis was performed following the standard protocol without IV contrast. COMPARISON:  CT 09/10/2017 FINDINGS: Lower chest: Lung bases demonstrate no acute consolidation or effusion. The heart size is normal. Hepatobiliary: Calcified granuloma. No calcified gallstone or biliary dilatation Pancreas: Unremarkable. No pancreatic ductal dilatation or surrounding inflammatory changes. Spleen: Normal in size without focal abnormality. Adrenals/Urinary Tract: Adrenal glands are normal. Mild right hydronephrosis, secondary to a 3 mm stone in the proximal right ureter just past the right UPJ. Negative for left hydronephrosis. Bladder is normal Stomach/Bowel: Stomach is within normal limits. Appendix appears normal. No evidence of bowel wall thickening, distention, or inflammatory changes. Sigmoid colon diverticular disease without acute inflammatory process Vascular/Lymphatic:  Mild aortic atherosclerosis. No aneurysm. No significantly enlarged lymph nodes. Reproductive: Prostate is unremarkable. Other: Negative for free air or free fluid Musculoskeletal: No acute or significant osseous findings. IMPRESSION: 1. Mild right hydronephrosis, secondary to a 3 mm stone in the proximal right ureter just past the right UPJ. 2. Sigmoid colon diverticula without acute inflammatory process Electronically Signed   By: Jasmine Pang M.D.   On: 07/22/2018 21:23    Procedures Procedures (including critical care time)  Medications Ordered in ED Medications  HYDROcodone-acetaminophen (NORCO/VICODIN) 5-325 MG per tablet 1 tablet (has no administration in time range)     Initial Impression / Assessment and Plan / ED Course  I have reviewed the triage vital signs and the nursing notes.  Pertinent labs & imaging results that were available during my care of the patient were reviewed by me and considered in my medical decision making (  see chart for details).        CT scan shows evidence of a right ureteral stone measuring 3 mm.  Sizes reassuring that patient may be passage.  Patient treated with 1 dose of hydrocodone here.  Will be given a prepack of hydrocodone to go home with.  He will call urology for follow-up.  Labs without any significant abnormalities.  Final Clinical Impressions(s) / ED Diagnoses   Final diagnoses:  Right flank pain  Right ureteral stone    ED Discharge Orders    None       Vanetta MuldersZackowski, Shandell Jallow, MD 07/22/18 2206

## 2018-07-22 NOTE — ED Triage Notes (Signed)
Patient started having right flank pain with blood in his urine starting this am. Patient was in [facility] 2 weeks ago and [facility] 3 weeks ago, placed in N95 mask.

## 2018-07-23 MED FILL — Hydrocodone-Acetaminophen Tab 5-325 MG: ORAL | Qty: 6 | Status: AC

## 2019-02-03 ENCOUNTER — Encounter (HOSPITAL_COMMUNITY): Payer: Self-pay | Admitting: Emergency Medicine

## 2019-02-03 ENCOUNTER — Emergency Department (HOSPITAL_COMMUNITY): Payer: BC Managed Care – PPO

## 2019-02-03 ENCOUNTER — Other Ambulatory Visit: Payer: Self-pay

## 2019-02-03 ENCOUNTER — Inpatient Hospital Stay (HOSPITAL_COMMUNITY)
Admission: EM | Admit: 2019-02-03 | Discharge: 2019-02-07 | DRG: 193 | Disposition: A | Discharge status: Home / Self Care | Payer: BC Managed Care – PPO | Attending: Family Medicine | Admitting: Family Medicine

## 2019-02-03 DIAGNOSIS — E1165 Type 2 diabetes mellitus with hyperglycemia: Secondary | ICD-10-CM | POA: Diagnosis not present

## 2019-02-03 DIAGNOSIS — R079 Chest pain, unspecified: Secondary | ICD-10-CM

## 2019-02-03 DIAGNOSIS — J189 Pneumonia, unspecified organism: Secondary | ICD-10-CM | POA: Diagnosis not present

## 2019-02-03 DIAGNOSIS — Z888 Allergy status to other drugs, medicaments and biological substances status: Secondary | ICD-10-CM

## 2019-02-03 DIAGNOSIS — J440 Chronic obstructive pulmonary disease with acute lower respiratory infection: Secondary | ICD-10-CM | POA: Diagnosis present

## 2019-02-03 DIAGNOSIS — I10 Essential (primary) hypertension: Secondary | ICD-10-CM | POA: Diagnosis present

## 2019-02-03 DIAGNOSIS — Z960 Presence of urogenital implants: Secondary | ICD-10-CM | POA: Diagnosis present

## 2019-02-03 DIAGNOSIS — R0602 Shortness of breath: Secondary | ICD-10-CM | POA: Diagnosis not present

## 2019-02-03 DIAGNOSIS — R109 Unspecified abdominal pain: Secondary | ICD-10-CM

## 2019-02-03 DIAGNOSIS — Z6841 Body Mass Index (BMI) 40.0 and over, adult: Secondary | ICD-10-CM

## 2019-02-03 DIAGNOSIS — R000 Tachycardia, unspecified: Secondary | ICD-10-CM

## 2019-02-03 DIAGNOSIS — Z716 Tobacco abuse counseling: Secondary | ICD-10-CM

## 2019-02-03 DIAGNOSIS — F17210 Nicotine dependence, cigarettes, uncomplicated: Secondary | ICD-10-CM | POA: Diagnosis present

## 2019-02-03 DIAGNOSIS — J441 Chronic obstructive pulmonary disease with (acute) exacerbation: Secondary | ICD-10-CM | POA: Diagnosis present

## 2019-02-03 DIAGNOSIS — R0600 Dyspnea, unspecified: Secondary | ICD-10-CM | POA: Diagnosis present

## 2019-02-03 DIAGNOSIS — Z20828 Contact with and (suspected) exposure to other viral communicable diseases: Secondary | ICD-10-CM | POA: Diagnosis present

## 2019-02-03 DIAGNOSIS — J45901 Unspecified asthma with (acute) exacerbation: Secondary | ICD-10-CM | POA: Diagnosis present

## 2019-02-03 DIAGNOSIS — D72829 Elevated white blood cell count, unspecified: Secondary | ICD-10-CM | POA: Diagnosis present

## 2019-02-03 DIAGNOSIS — T380X5A Adverse effect of glucocorticoids and synthetic analogues, initial encounter: Secondary | ICD-10-CM | POA: Diagnosis not present

## 2019-02-03 DIAGNOSIS — N200 Calculus of kidney: Secondary | ICD-10-CM | POA: Diagnosis present

## 2019-02-03 DIAGNOSIS — Z841 Family history of disorders of kidney and ureter: Secondary | ICD-10-CM

## 2019-02-03 DIAGNOSIS — R739 Hyperglycemia, unspecified: Secondary | ICD-10-CM | POA: Diagnosis not present

## 2019-02-03 DIAGNOSIS — J9601 Acute respiratory failure with hypoxia: Secondary | ICD-10-CM | POA: Diagnosis present

## 2019-02-03 DIAGNOSIS — E119 Type 2 diabetes mellitus without complications: Secondary | ICD-10-CM

## 2019-02-03 LAB — CBC
HCT: 51.7 % (ref 39.0–52.0)
Hemoglobin: 16.9 g/dL (ref 13.0–17.0)
MCH: 30.3 pg (ref 26.0–34.0)
MCHC: 32.7 g/dL (ref 30.0–36.0)
MCV: 92.7 fL (ref 80.0–100.0)
Platelets: 341 10*3/uL (ref 150–400)
RBC: 5.58 MIL/uL (ref 4.22–5.81)
RDW: 13.3 % (ref 11.5–15.5)
WBC: 14.2 10*3/uL — ABNORMAL HIGH (ref 4.0–10.5)
nRBC: 0 % (ref 0.0–0.2)

## 2019-02-03 LAB — BASIC METABOLIC PANEL
Anion gap: 12 (ref 5–15)
BUN: 12 mg/dL (ref 6–20)
CO2: 24 mmol/L (ref 22–32)
Calcium: 9.6 mg/dL (ref 8.9–10.3)
Chloride: 100 mmol/L (ref 98–111)
Creatinine, Ser: 0.94 mg/dL (ref 0.61–1.24)
GFR calc Af Amer: >60 mL/min (ref >60)
GFR calc non Af Amer: >60 mL/min (ref >60)
Glucose, Bld: 128 mg/dL — ABNORMAL HIGH (ref 70–99)
Potassium: 4.3 mmol/L (ref 3.5–5.1)
Sodium: 136 mmol/L (ref 135–145)

## 2019-02-03 LAB — TROPONIN I (HIGH SENSITIVITY): Troponin I (High Sensitivity): 3 ng/L (ref <18)

## 2019-02-03 MED ORDER — IOHEXOL 350 MG/ML SOLN
100.0000 mL | Freq: Once | INTRAVENOUS | Status: AC | PRN
  Administered 2019-02-03: 100 mL via INTRAVENOUS

## 2019-02-03 MED ORDER — SODIUM CHLORIDE 0.9 % IV BOLUS
1000.0000 mL | Freq: Once | INTRAVENOUS | Status: AC
  Administered 2019-02-03: 1000 mL via INTRAVENOUS

## 2019-02-03 MED ORDER — HYDROMORPHONE HCL 1 MG/ML IJ SOLN
1.0000 mg | Freq: Once | INTRAMUSCULAR | Status: AC
  Administered 2019-02-03: 1 mg via INTRAVENOUS
  Filled 2019-02-03: qty 1

## 2019-02-03 MED ORDER — SODIUM CHLORIDE 0.9% FLUSH: 3.0000 mL | Freq: Once | INTRAVENOUS | Status: DC

## 2019-02-03 NOTE — ED Provider Notes (Signed)
[facility] Emergency Department Provider Note MRN:  [id number]  Arrival date & time: 02/03/19     Chief Complaint   Chest Pain   History of Present Illness   Edward Booth is a 38 y.o. year-old male with a history of hypertension presenting to the ED with chief complaint of chest pain.  Patient explains that he was diagnosed with a kidney infection last week at [facility].  Has been taking antibiotics.  Presented with left flank pain.  He explains that today the pain suddenly became more severe and more superior, no located in the left lower lateral chest and left anterior chest, pain is much worse with deep breaths.  Pain associated with shortness of breath.  Denies fever, no cough, no abdominal pain.  Review of Systems  A complete 10 system review of systems was obtained and all systems are negative except as noted in the HPI and PMH.   Patient's Health History    Past Medical History:  Diagnosis Date  . Dysuria   . Family history of adverse reaction to anesthesia    mother-- ponv  . Headache   . Hematuria   . Left ureteral stone   . Nephrolithiasis    bilateral non-obstructive per CT 09-10-2017  . Urgency of urination     Past Surgical History:  Procedure Laterality Date  . CYSTOSCOPY W/ URETERAL STENT PLACEMENT Left 09/21/2017   Procedure: CYSTOSCOPY WITH RETROGRADE PYELOGRAM/URETERAL STENT PLACEMENT;  Surgeon: Lucas Mallow, MD;  Location: [facility];  Service: Urology;  Laterality: Left;  . CYSTOSCOPY WITH RETROGRADE PYELOGRAM, URETEROSCOPY AND STENT PLACEMENT Left 10/07/2017   Procedure: CYSTOSCOPY WITH LEFT  RETROGRADE PYELOGRAM, URETEROSCOPY ,LITHROTRIPSY.  AND STENT REPLACEMENT;  Surgeon: Lucas Mallow, MD;  Location: [facility];  Service: Urology;  Laterality: Left;  . HOLMIUM LASER APPLICATION Left 09/02/6999   Procedure: HOLMIUM LASER APPLICATION;  Surgeon: Lucas Mallow, MD;  Location: [facility];   Service: Urology;  Laterality: Left;  . INGUINAL HERNIA REPAIR Left 1986    History reviewed. No pertinent family history.  Social History   Socioeconomic History  . Marital status: Married    Spouse name: Not on file  . Number of children: Not on file  . Years of education: Not on file  . Highest education level: Not on file  Occupational History  . Not on file  Social Needs  . Financial resource strain: Not on file  . Food insecurity    Worry: Not on file    Inability: Not on file  . Transportation needs    Medical: Not on file    Non-medical: Not on file  Tobacco Use  . Smoking status: Current Every Day Smoker    Packs/day: 1.00    Years: 20.00    Pack years: 20.00    Types: Cigarettes  . Smokeless tobacco: Never Used  Substance and Sexual Activity  . Alcohol use: Yes    Comment: occasionally  . Drug use: Never  . Sexual activity: Not on file  Lifestyle  . Physical activity    Days per week: Not on file    Minutes per session: Not on file  . Stress: Not on file  Relationships  . Social Herbalist on phone: Not on file    Gets together: Not on file    Attends religious service: Not on file    Active member of club or organization: Not on  file    Attends meetings of clubs or organizations: Not on file    Relationship status: Not on file  . Intimate partner violence    Fear of current or ex partner: Not on file    Emotionally abused: Not on file    Physically abused: Not on file    Forced sexual activity: Not on file  Other Topics Concern  . Not on file  Social History Narrative  . Not on file     Physical Exam  Vital Signs and Nursing Notes reviewed Vitals:   02/03/19 1855  BP: (!) 146/106  Pulse: (!) 128  Resp: 20  Temp: 99 F (37.2 C)  SpO2: 94%    CONSTITUTIONAL: Well-appearing, in moderate distress due to pain NEURO:  Alert and oriented x 3, no focal deficits EYES:  eyes equal and reactive ENT/NECK:  no LAD, no JVD CARDIO:  Tachycardic rate, well-perfused, normal S1 and S2 PULM:  CTAB no wheezing or rhonchi, tachypneic with rapid shallow breaths GI/GU:  normal bowel sounds, non-distended, non-tender MSK/SPINE:  No gross deformities, no edema SKIN:  no rash, atraumatic PSYCH:  Appropriate speech and behavior  Diagnostic and Interventional Summary    EKG Interpretation  Date/Time:  Thursday February 03 2019 18:55:01 EDT Ventricular Rate:  122 PR Interval:  124 QRS Duration: 92 QT Interval:  318 QTC Calculation: 453 R Axis:   1 Text Interpretation:  Sinus tachycardia Moderate voltage criteria for LVH, may be normal variant Borderline ECG Confirmed by Kennis Carina [phone]) on 02/03/2019 10:00:48 PM      Labs Reviewed  CBC - Abnormal; Notable for the following components:      Result Value   WBC 14.2 (*)    All other components within normal limits  BASIC METABOLIC PANEL  URINALYSIS, ROUTINE W REFLEX MICROSCOPIC  TROPONIN I (HIGH SENSITIVITY)  TROPONIN I (HIGH SENSITIVITY)    DG Chest 2 View  Final Result    CT RENAL STONE STUDY    (Results Pending)  CT ANGIO CHEST PE W OR WO CONTRAST    (Results Pending)    Medications  sodium chloride flush (NS) 0.9 % injection 3 mL (has no administration in time range)  sodium chloride 0.9 % bolus 1,000 mL (has no administration in time range)  HYDROmorphone (DILAUDID) injection 1 mg (has no administration in time range)     Procedures Critical Care Critical Care Documentation Critical care time provided by me (excluding procedures): 31 minutes  Condition necessitating critical care: Concern for acute pulmonary embolism  Components of critical care management: reviewing of prior records, laboratory and imaging interpretation, frequent re-examination and reassessment of vital signs.     ED Course and Medical Decision Making  I have reviewed the triage vital signs and the nursing notes.  Pertinent labs & imaging results that were available during my  care of the patient were reviewed by me and considered in my medical decision making (see below for details).  Question of kidney stone versus worsening pyelonephritis versus pulmonary embolism.  Will provide pain control and obtain CT imaging.  No evidence of DVT on exam, hemodynamically stable.  Still awaiting CT imaging results, signed out to oncoming provider at shift change.  Elmer Sow. Pilar Plate, MD [facility] Emergency Medicine [facility] [email or web address]  Final Clinical Impressions(s) / ED Diagnoses     ICD-10-CM   1. Chest pain, unspecified type  R07.9   2. Flank pain  R10.9   3. Shortness of breath  R06.02   4. Tachycardia  R00.0     ED Discharge Orders    None      Discharge Instructions Discussed with and Provided to Patient: Discharge Instructions   None       Sabas SousBero, Keirstyn Aydt M, MD 02/03/19 2315

## 2019-02-03 NOTE — ED Provider Notes (Signed)
Plan at signout is to f/u on CT imaging/labs    Ripley Fraise, MD 02/03/19 2323

## 2019-02-03 NOTE — ED Triage Notes (Signed)
Pt c/o of left sided cp that radiates to his left back.

## 2019-02-03 NOTE — ED Notes (Signed)
ED Provider at bedside. 

## 2019-02-04 ENCOUNTER — Emergency Department (HOSPITAL_COMMUNITY): Payer: BC Managed Care – PPO

## 2019-02-04 ENCOUNTER — Encounter (HOSPITAL_COMMUNITY): Payer: Self-pay | Admitting: Internal Medicine

## 2019-02-04 ENCOUNTER — Inpatient Hospital Stay (HOSPITAL_COMMUNITY): Payer: BC Managed Care – PPO

## 2019-02-04 DIAGNOSIS — R079 Chest pain, unspecified: Secondary | ICD-10-CM | POA: Diagnosis not present

## 2019-02-04 DIAGNOSIS — N200 Calculus of kidney: Secondary | ICD-10-CM | POA: Diagnosis present

## 2019-02-04 DIAGNOSIS — T380X5A Adverse effect of glucocorticoids and synthetic analogues, initial encounter: Secondary | ICD-10-CM | POA: Diagnosis not present

## 2019-02-04 DIAGNOSIS — R739 Hyperglycemia, unspecified: Secondary | ICD-10-CM | POA: Diagnosis not present

## 2019-02-04 DIAGNOSIS — I10 Essential (primary) hypertension: Secondary | ICD-10-CM | POA: Diagnosis present

## 2019-02-04 DIAGNOSIS — R0600 Dyspnea, unspecified: Secondary | ICD-10-CM

## 2019-02-04 DIAGNOSIS — Z6841 Body Mass Index (BMI) 40.0 and over, adult: Secondary | ICD-10-CM | POA: Diagnosis not present

## 2019-02-04 DIAGNOSIS — Z841 Family history of disorders of kidney and ureter: Secondary | ICD-10-CM | POA: Diagnosis not present

## 2019-02-04 DIAGNOSIS — J441 Chronic obstructive pulmonary disease with (acute) exacerbation: Secondary | ICD-10-CM | POA: Diagnosis present

## 2019-02-04 DIAGNOSIS — Z716 Tobacco abuse counseling: Secondary | ICD-10-CM | POA: Diagnosis not present

## 2019-02-04 DIAGNOSIS — Z960 Presence of urogenital implants: Secondary | ICD-10-CM | POA: Diagnosis present

## 2019-02-04 DIAGNOSIS — J189 Pneumonia, unspecified organism: Principal | ICD-10-CM

## 2019-02-04 DIAGNOSIS — J440 Chronic obstructive pulmonary disease with acute lower respiratory infection: Secondary | ICD-10-CM | POA: Diagnosis present

## 2019-02-04 DIAGNOSIS — R0602 Shortness of breath: Secondary | ICD-10-CM | POA: Diagnosis present

## 2019-02-04 DIAGNOSIS — E1165 Type 2 diabetes mellitus with hyperglycemia: Secondary | ICD-10-CM | POA: Diagnosis not present

## 2019-02-04 DIAGNOSIS — J9601 Acute respiratory failure with hypoxia: Secondary | ICD-10-CM | POA: Diagnosis present

## 2019-02-04 DIAGNOSIS — F17210 Nicotine dependence, cigarettes, uncomplicated: Secondary | ICD-10-CM | POA: Diagnosis present

## 2019-02-04 DIAGNOSIS — J45901 Unspecified asthma with (acute) exacerbation: Secondary | ICD-10-CM | POA: Diagnosis present

## 2019-02-04 DIAGNOSIS — Z888 Allergy status to other drugs, medicaments and biological substances status: Secondary | ICD-10-CM | POA: Diagnosis not present

## 2019-02-04 DIAGNOSIS — Z20828 Contact with and (suspected) exposure to other viral communicable diseases: Secondary | ICD-10-CM | POA: Diagnosis present

## 2019-02-04 LAB — COMPREHENSIVE METABOLIC PANEL
ALT: 26 U/L (ref 0–44)
AST: 17 U/L (ref 15–41)
Albumin: 3.8 g/dL (ref 3.5–5.0)
Alkaline Phosphatase: 89 U/L (ref 38–126)
Anion gap: 9 (ref 5–15)
BUN: 11 mg/dL (ref 6–20)
CO2: 24 mmol/L (ref 22–32)
Calcium: 8.5 mg/dL — ABNORMAL LOW (ref 8.9–10.3)
Chloride: 101 mmol/L (ref 98–111)
Creatinine, Ser: 0.96 mg/dL (ref 0.61–1.24)
GFR calc Af Amer: >60 mL/min (ref >60)
GFR calc non Af Amer: >60 mL/min (ref >60)
Glucose, Bld: 151 mg/dL — ABNORMAL HIGH (ref 70–99)
Potassium: 4 mmol/L (ref 3.5–5.1)
Sodium: 134 mmol/L — ABNORMAL LOW (ref 135–145)
Total Bilirubin: 0.6 mg/dL (ref 0.3–1.2)
Total Protein: 7.1 g/dL (ref 6.5–8.1)

## 2019-02-04 LAB — ECHOCARDIOGRAM COMPLETE
Height: 72 in
Weight: 5552 oz

## 2019-02-04 LAB — HEPATIC FUNCTION PANEL
ALT: 30 U/L (ref 0–44)
AST: 19 U/L (ref 15–41)
Albumin: 4.2 g/dL (ref 3.5–5.0)
Alkaline Phosphatase: 101 U/L (ref 38–126)
Bilirubin, Direct: 0.1 mg/dL (ref 0.0–0.2)
Indirect Bilirubin: 0.5 mg/dL (ref 0.3–0.9)
Total Bilirubin: 0.6 mg/dL (ref 0.3–1.2)
Total Protein: 7.9 g/dL (ref 6.5–8.1)

## 2019-02-04 LAB — TSH: TSH: 3.559 u[IU]/mL (ref 0.350–4.500)

## 2019-02-04 LAB — CBC
HCT: 45 % (ref 39.0–52.0)
Hemoglobin: 15 g/dL (ref 13.0–17.0)
MCH: 31.1 pg (ref 26.0–34.0)
MCHC: 33.3 g/dL (ref 30.0–36.0)
MCV: 93.4 fL (ref 80.0–100.0)
Platelets: 303 10*3/uL (ref 150–400)
RBC: 4.82 MIL/uL (ref 4.22–5.81)
RDW: 13.2 % (ref 11.5–15.5)
WBC: 12.5 10*3/uL — ABNORMAL HIGH (ref 4.0–10.5)
nRBC: 0 % (ref 0.0–0.2)

## 2019-02-04 LAB — TROPONIN I (HIGH SENSITIVITY)
Troponin I (High Sensitivity): 2 ng/L (ref <18)
Troponin I (High Sensitivity): 2 ng/L (ref <18)

## 2019-02-04 LAB — STREP PNEUMONIAE URINARY ANTIGEN: Strep Pneumo Urinary Antigen: NEGATIVE

## 2019-02-04 LAB — URINALYSIS, ROUTINE W REFLEX MICROSCOPIC
Bilirubin Urine: NEGATIVE
Glucose, UA: NEGATIVE mg/dL
Hgb urine dipstick: NEGATIVE
Ketones, ur: 5 mg/dL — AB
Leukocytes,Ua: NEGATIVE
Nitrite: NEGATIVE
Protein, ur: NEGATIVE mg/dL
Specific Gravity, Urine: 1.034 — ABNORMAL HIGH (ref 1.005–1.030)
pH: 5 (ref 5.0–8.0)

## 2019-02-04 LAB — SARS CORONAVIRUS 2 BY RT PCR (HOSPITAL ORDER, PERFORMED IN ~~LOC~~ HOSPITAL LAB)
SARS Coronavirus 2: NEGATIVE
SARS Coronavirus 2: NEGATIVE

## 2019-02-04 LAB — HIV ANTIBODY (ROUTINE TESTING W REFLEX): HIV Screen 4th Generation wRfx: NONREACTIVE

## 2019-02-04 LAB — LACTIC ACID, PLASMA
Lactic Acid, Venous: 1.5 mmol/L (ref 0.5–1.9)
Lactic Acid, Venous: 1.3 mmol/L (ref 0.5–1.9)

## 2019-02-04 MED ORDER — METHYLPREDNISOLONE SODIUM SUCC 125 MG IJ SOLR
80.0000 mg | Freq: Three times a day (TID) | INTRAMUSCULAR | Status: DC
  Administered 2019-02-04: 80 mg via INTRAVENOUS
  Filled 2019-02-04: qty 2

## 2019-02-04 MED ORDER — METHYLPREDNISOLONE SODIUM SUCC 40 MG IJ SOLR
40.0000 mg | Freq: Three times a day (TID) | INTRAMUSCULAR | Status: DC
  Administered 2019-02-04 – 2019-02-06 (×6): 40 mg via INTRAVENOUS
  Filled 2019-02-04 (×6): qty 1

## 2019-02-04 MED ORDER — ALBUTEROL SULFATE (2.5 MG/3ML) 0.083% IN NEBU: 3.0000 mL | INHALATION_SOLUTION | RESPIRATORY_TRACT | Status: DC | PRN

## 2019-02-04 MED ORDER — SODIUM CHLORIDE 0.9% FLUSH: 3.0000 mL | INTRAVENOUS | Status: DC | PRN

## 2019-02-04 MED ORDER — SODIUM CHLORIDE 0.9% FLUSH
3.0000 mL | Freq: Two times a day (BID) | INTRAVENOUS | Status: DC
  Administered 2019-02-04 – 2019-02-07 (×6): 3 mL via INTRAVENOUS

## 2019-02-04 MED ORDER — HYDROMORPHONE HCL 1 MG/ML IJ SOLN
1.0000 mg | Freq: Once | INTRAMUSCULAR | Status: AC
  Administered 2019-02-04: 1 mg via INTRAVENOUS
  Filled 2019-02-04: qty 1

## 2019-02-04 MED ORDER — HYDROCODONE-ACETAMINOPHEN 5-325 MG PO TABS
1.0000 | ORAL_TABLET | Freq: Four times a day (QID) | ORAL | Status: DC | PRN
  Administered 2019-02-04 – 2019-02-07 (×5): 1 via ORAL
  Filled 2019-02-04 (×5): qty 1

## 2019-02-04 MED ORDER — ENOXAPARIN SODIUM 80 MG/0.8ML ~~LOC~~ SOLN
80.0000 mg | SUBCUTANEOUS | Status: DC
  Administered 2019-02-04: 80 mg via SUBCUTANEOUS
  Filled 2019-02-04: qty 0.8

## 2019-02-04 MED ORDER — SODIUM CHLORIDE 0.9 % IV SOLN
500.0000 mg | INTRAVENOUS | Status: DC
  Administered 2019-02-04 – 2019-02-06 (×4): 500 mg via INTRAVENOUS
  Filled 2019-02-04 (×4): qty 500

## 2019-02-04 MED ORDER — ACETAMINOPHEN 650 MG RE SUPP: 650.0000 mg | Freq: Four times a day (QID) | RECTAL | Status: DC | PRN

## 2019-02-04 MED ORDER — SODIUM CHLORIDE 0.9 % IV SOLN
2.0000 g | INTRAVENOUS | Status: DC
  Administered 2019-02-04 – 2019-02-06 (×4): 2 g via INTRAVENOUS
  Filled 2019-02-04 (×4): qty 20

## 2019-02-04 MED ORDER — ENOXAPARIN SODIUM 80 MG/0.8ML ~~LOC~~ SOLN
80.0000 mg | SUBCUTANEOUS | Status: DC
  Administered 2019-02-04 – 2019-02-06 (×3): 80 mg via SUBCUTANEOUS
  Filled 2019-02-04 (×3): qty 0.8

## 2019-02-04 MED ORDER — SODIUM CHLORIDE 0.9 % IV SOLN
250.0000 mL | INTRAVENOUS | Status: DC | PRN
  Administered 2019-02-05 (×2): 250 mL via INTRAVENOUS

## 2019-02-04 MED ORDER — CHLORHEXIDINE GLUCONATE CLOTH 2 % EX PADS
6.0000 | MEDICATED_PAD | Freq: Every day | CUTANEOUS | Status: DC
  Administered 2019-02-05 – 2019-02-07 (×3): 6 via TOPICAL

## 2019-02-04 MED ORDER — KETOROLAC TROMETHAMINE 30 MG/ML IJ SOLN
30.0000 mg | Freq: Once | INTRAMUSCULAR | Status: AC
  Administered 2019-02-04: 30 mg via INTRAVENOUS
  Filled 2019-02-04: qty 1

## 2019-02-04 MED ORDER — GUAIFENESIN ER 600 MG PO TB12
600.0000 mg | ORAL_TABLET | Freq: Two times a day (BID) | ORAL | Status: DC
  Administered 2019-02-04 – 2019-02-07 (×6): 600 mg via ORAL
  Filled 2019-02-04 (×6): qty 1

## 2019-02-04 MED ORDER — ALBUTEROL SULFATE (2.5 MG/3ML) 0.083% IN NEBU
3.0000 mL | INHALATION_SOLUTION | Freq: Four times a day (QID) | RESPIRATORY_TRACT | Status: DC
  Administered 2019-02-04 – 2019-02-07 (×15): 3 mL via RESPIRATORY_TRACT
  Filled 2019-02-04 (×15): qty 3

## 2019-02-04 MED ORDER — ACETAMINOPHEN 325 MG PO TABS: 650.0000 mg | ORAL_TABLET | Freq: Four times a day (QID) | ORAL | Status: DC | PRN

## 2019-02-04 NOTE — ED Provider Notes (Signed)
I assumed care in signout to follow-up on imaging.  No PE, but patient does have evidence of pneumonia.  Patient appears very uncomfortable, he is reporting pleuritic pain and is short of breath.  Wife also reports he spiked a fever.  He is COVID negative at this time.  Due to persistent pain, tachypnea and tachycardia he is also on oxygen, will be admitted for pneumonia. He will remain a person under investigation as he still may have COVID Discussed with Dr. Maudie Mercury for admission.  He is ordered steroids as well as albuterol due to his smoking history   Ripley Fraise, MD 02/04/19 [phone]

## 2019-02-04 NOTE — ED Notes (Signed)
Question patient about CPAP, he does not have home CPAP. His oxygen saturation appears good on 3.5 liters about 95. Will obtain CPAP if he has problems at night sleeping. He is obese and may have some sleep problems. Will continue to monitor

## 2019-02-04 NOTE — ED Notes (Signed)
Pt c/o difficulty breathing. Pt placed on 2L Nasal Cannula and Respiratory has been called to provide Pt a breathing treatment.

## 2019-02-04 NOTE — H&P (Addendum)
TRH H&P    Patient Demographics:    Edward Booth, is a 38 y.o. male  MRN: [id number]  DOB - 16-Jul-1980  Admit Date - 02/03/2019  Referring MD/NP/PA: Bebe Shaggy  Outpatient Primary MD for the patient is System, Pcp Not In  Patient coming from:  home  Chief complaint-  Chest pain dyspnea   HPI:    Edward Booth  is a 38 y.o. male, w nephrolithiasis, apparently c/o chest pain / pleurititic, and dyspnea starting earlier yesterday.  Pt denies fever, chills, cough, palp, n/v, diarrhea, alteration in taste/ smell. , brbpr, black stool, dysuria, hematuria.   In ED,  T 99 P 128 R 20, Bp 146/106  Pox 94% on RA WT 157.4kg  CXR IMPRESSION: 1. Increasing streaky bibasilar opacities, developing pneumonia versus atelectasis. 2. Small left pleural effusion on CT not well seen radiographically.  CTA chest IMPRESSION: 1. No central pulmonary embolus, to the distal lobar/proximal segmental level. 2. Heterogeneous bilateral lower lobe linear and ground-glass opacities, left greater than right. Findings may be infectious or inflammatory. Small left pleural effusion. 3. Coronary artery calcifications.  Lactic acid 1.3 Ast 19, Alt 30 Na 136, K 4.3, Bun 12, Creatinine 0.94 Wbc 14.2, Hgb 16.9, Plt 341 Trop 3  Blood culture x2 pending Urinalysis pending  Pt will be admitted for dyspnea, CAP, r/o covid -19,     Review of systems:    In addition to the HPI above,  No Fever-chills, No Headache, No changes with Vision or hearing, No problems swallowing food or Liquids,  No Abdominal pain, No Nausea or Vomiting, bowel movements are regular, No Blood in stool or Urine, No dysuria, No new skin rashes or bruises, No new joints pains-aches,  No new weakness, tingling, numbness in any extremity, No recent weight gain or loss, No polyuria, polydypsia or polyphagia, No significant Mental Stressors.  All  other systems reviewed and are negative.    Past History of the following :    Past Medical History:  Diagnosis Date   Dysuria    Family history of adverse reaction to anesthesia    mother-- ponv   Headache    Hematuria    Left ureteral stone    Nephrolithiasis    bilateral non-obstructive per CT 09-10-2017   Urgency of urination       Past Surgical History:  Procedure Laterality Date   CYSTOSCOPY W/ URETERAL STENT PLACEMENT Left 09/21/2017   Procedure: CYSTOSCOPY WITH RETROGRADE PYELOGRAM/URETERAL STENT PLACEMENT;  Surgeon: Crista Elliot, MD;  Location: [facility];  Service: Urology;  Laterality: Left;   CYSTOSCOPY WITH RETROGRADE PYELOGRAM, URETEROSCOPY AND STENT PLACEMENT Left 10/07/2017   Procedure: CYSTOSCOPY WITH LEFT  RETROGRADE PYELOGRAM, URETEROSCOPY ,LITHROTRIPSY.  AND STENT REPLACEMENT;  Surgeon: Crista Elliot, MD;  Location: [facility];  Service: Urology;  Laterality: Left;   HOLMIUM LASER APPLICATION Left 10/07/2017   Procedure: HOLMIUM LASER APPLICATION;  Surgeon: Crista Elliot, MD;  Location: [facility];  Service: Urology;  Laterality: Left;   INGUINAL HERNIA  REPAIR Left 1986      Social History:      Social History   Tobacco Use   Smoking status: Current Every Day Smoker    Packs/day: 1.00    Years: 20.00    Pack years: 20.00    Types: Cigarettes   Smokeless tobacco: Never Used  Substance Use Topics   Alcohol use: Yes    Comment: occasionally       Family History :     Family History  Problem Relation Age of Onset   CAD Mother    Kidney failure Mother        Home Medications:   Prior to Admission medications   Medication Sig Start Date End Date Taking? Authorizing Provider  HYDROcodone-acetaminophen (NORCO/VICODIN) 5-325 MG tablet Take 1 tablet by mouth every 6 (six) hours as needed. 07/22/18   Vanetta Mulders, MD     Allergies:     Allergies  Allergen Reactions   Aspartame  And Phenylalanine Anaphylaxis     Physical Exam:   Vitals  Blood pressure 129/73, pulse 97, temperature 99 F (37.2 C), temperature source Oral, resp. rate (!) 21, height 6' (1.829 m), weight (!) 157.4 kg, SpO2 94 %.  1.  General: axoxo3  2. Psychiatric: euthymic  3. Neurologic: Nonfocal, cn2-12 intact, reflexes 2+ symmetric, diffuse with no clonus, motor 5/5 in all 4 ext  4. HEENMT:  Anicteric, pupils 1.55mm symmetric, direct, consensual, intact Neck: no jvd  5. Respiratory : Slight crackles bilateral base, no wheezing  6. Cardiovascular : rrr s1, s2,   7. Gastrointestinal:  Abd: soft, nt, nd, +bs  8. Skin:  Ext: no c/c/e, no rash  9.Musculoskeletal Good ROM    Data Review:    CBC Recent Labs  Lab 02/03/19 2101  WBC 14.2*  HGB 16.9  HCT 51.7  PLT 341  MCV 92.7  MCH 30.3  MCHC 32.7  RDW 13.3   ------------------------------------------------------------------------------------------------------------------  Results for orders placed or performed during the hospital encounter of 02/03/19 (from the past 48 hour(s))  Basic metabolic panel     Status: Abnormal   Collection Time: 02/03/19  9:01 PM  Result Value Ref Range   Sodium 136 135 - 145 mmol/L   Potassium 4.3 3.5 - 5.1 mmol/L   Chloride 100 98 - 111 mmol/L   CO2 24 22 - 32 mmol/L   Glucose, Bld 128 (H) 70 - 99 mg/dL   BUN 12 6 - 20 mg/dL   Creatinine, Ser 7.82 0.61 - 1.24 mg/dL   Calcium 9.6 8.9 - 95.6 mg/dL   GFR calc non Af Amer >60 >60 mL/min   GFR calc Af Amer >60 >60 mL/min   Anion gap 12 5 - 15    Comment: Performed at [facility], [address] [place], [place] [address]  CBC     Status: Abnormal   Collection Time: 02/03/19  9:01 PM  Result Value Ref Range   WBC 14.2 (H) 4.0 - 10.5 K/uL   RBC 5.58 4.22 - 5.81 MIL/uL   Hemoglobin 16.9 13.0 - 17.0 g/dL   HCT 65.7 84.6 - 96.2 %   MCV 92.7 80.0 - 100.0 fL   MCH 30.3 26.0 - 34.0 pg   MCHC 32.7 30.0 - 36.0 g/dL   RDW 95.2 84.1 -  32.4 %   Platelets 341 150 - 400 K/uL   nRBC 0.0 0.0 - 0.2 %    Comment: Performed at [facility], [address] [place], [place] [address]  Troponin I (High Sensitivity)     Status: None   Collection Time: 02/03/19  9:01 PM  Result Value Ref Range   Troponin I (High Sensitivity) 3 <18 ng/L    Comment: (NOTE) Elevated high sensitivity troponin I (hsTnI) values and significant  changes across serial measurements may suggest ACS but many other  chronic and acute conditions are known to elevate hsTnI results.  Refer to the "Links" section for chest pain algorithms and additional  guidance. Performed at [facility], [address] [place], [place] [address]   Lactic acid, plasma     Status: None   Collection Time: 02/03/19  9:01 PM  Result Value Ref Range   Lactic Acid, Venous 1.5 0.5 - 1.9 mmol/L    Comment: Performed at [facility], [address] [place], [place] [address]  Troponin I (High Sensitivity)     Status: None   Collection Time: 02/03/19 11:35 PM  Result Value Ref Range   Troponin I (High Sensitivity) 2 <18 ng/L    Comment: (NOTE) Elevated high sensitivity troponin I (hsTnI) values and significant  changes across serial measurements may suggest ACS but many other  chronic and acute conditions are known to elevate hsTnI results.  Refer to the "Links" section for chest pain algorithms and additional  guidance. Performed at [facility], [address] [place], [place] [address]   SARS Coronavirus 2 [facility](Hospital order, Performed in [facility] hospital lab) Nasopharyngeal Nasopharyngeal Swab     Status: None   Collection Time: 02/04/19 12:04 AM   Specimen: Nasopharyngeal Swab  Result Value Ref Range   SARS Coronavirus 2 NEGATIVE NEGATIVE    Comment: (NOTE) If result is NEGATIVE SARS-CoV-2 target nucleic acids are NOT DETECTED. The SARS-CoV-2 RNA is generally detectable in upper and lower  respiratory specimens during the acute phase of infection. The lowest    concentration of SARS-CoV-2 viral copies this assay can detect is 250  copies / mL. A negative result does not preclude SARS-CoV-2 infection  and should not be used as the sole basis for treatment or other  patient management decisions.  A negative result may occur with  improper specimen collection / handling, submission of specimen other  than nasopharyngeal swab, presence of viral mutation(s) within the  areas targeted by this assay, and inadequate number of viral copies  (<250 copies / mL). A negative result must be combined with clinical  observations, patient history, and epidemiological information. If result is POSITIVE SARS-CoV-2 target nucleic acids are DETECTED. The SARS-CoV-2 RNA is generally detectable in upper and lower  respiratory specimens dur ing the acute phase of infection.  Positive  results are indicative of active infection with SARS-CoV-2.  Clinical  correlation with patient history and other diagnostic information is  necessary to determine patient infection status.  Positive results do  not rule out bacterial infection or co-infection with other viruses. If result is PRESUMPTIVE POSTIVE SARS-CoV-2 nucleic acids MAY BE PRESENT.   A presumptive positive result was obtained on the submitted specimen  and confirmed on repeat testing.  While 2019 novel coronavirus  (SARS-CoV-2) nucleic acids may be present in the submitted sample  additional confirmatory testing may be necessary for epidemiological  and / or clinical management purposes  to differentiate between  SARS-CoV-2 and other Sarbecovirus currently known to infect humans.  If clinically indicated additional testing with an alternate test  methodology [phone]([id number]) is advised. The SARS-CoV-2 RNA is generally  detectable in upper and lower respiratory sp ecimens  during the acute  phase of infection. The expected result is Negative. Fact Sheet for Patients:  BoilerBrush.com.cy Fact Sheet  for Healthcare Providers: https://pope.com/ This test is not yet approved or cleared by the [place] FDA and has been authorized for detection and/or diagnosis of SARS-CoV-2 by FDA under an Emergency Use Authorization (EUA).  This EUA will remain in effect (meaning this test can be used) for the duration of the COVID-19 declaration under Section 564(b)(1) of the Act, 21 U.S.C. section 360bbb-3(b)(1), unless the authorization is terminated or revoked sooner. Performed at [facility], [address] [place], [place] [address]   Lactic acid, plasma     Status: None   Collection Time: 02/04/19  1:32 AM  Result Value Ref Range   Lactic Acid, Venous 1.3 0.5 - 1.9 mmol/L    Comment: Performed at [facility], [address] [place], [place] [address]  Hepatic function panel     Status: None   Collection Time: 02/04/19  1:32 AM  Result Value Ref Range   Total Protein 7.9 6.5 - 8.1 g/dL   Albumin 4.2 3.5 - 5.0 g/dL   AST 19 15 - 41 U/L   ALT 30 0 - 44 U/L   Alkaline Phosphatase 101 38 - 126 U/L   Total Bilirubin 0.6 0.3 - 1.2 mg/dL   Bilirubin, Direct 0.1 0.0 - 0.2 mg/dL   Indirect Bilirubin 0.5 0.3 - 0.9 mg/dL    Comment: Performed at [facility], [address] [place], [place] [address]    Chemistries  Recent Labs  Lab 02/03/19 2101 02/04/19 0132  NA 136  --   K 4.3  --   CL 100  --   CO2 24  --   GLUCOSE 128*  --   BUN 12  --   CREATININE 0.94  --   CALCIUM 9.6  --   AST  --  19  ALT  --  30  ALKPHOS  --  101  BILITOT  --  0.6   ------------------------------------------------------------------------------------------------------------------  ------------------------------------------------------------------------------------------------------------------ GFR: Estimated Creatinine Clearance: 161.8 mL/min (by C-G formula based on SCr of 0.94 mg/dL). Liver Function Tests: Recent Labs  Lab 02/04/19 0132  AST 19  ALT 30  ALKPHOS 101    BILITOT 0.6  PROT 7.9  ALBUMIN 4.2   No results for input(s): LIPASE, AMYLASE in the last 168 hours. No results for input(s): AMMONIA in the last 168 hours. Coagulation Profile: No results for input(s): INR, PROTIME in the last 168 hours. Cardiac Enzymes: No results for input(s): CKTOTAL, CKMB, CKMBINDEX, TROPONINI in the last 168 hours. BNP (last 3 results) No results for input(s): PROBNP in the last 8760 hours. HbA1C: No results for input(s): HGBA1C in the last 72 hours. CBG: No results for input(s): GLUCAP in the last 168 hours. Lipid Profile: No results for input(s): CHOL, HDL, LDLCALC, TRIG, CHOLHDL, LDLDIRECT in the last 72 hours. Thyroid Function Tests: No results for input(s): TSH, T4TOTAL, FREET4, T3FREE, THYROIDAB in the last 72 hours. Anemia Panel: No results for input(s): VITAMINB12, FOLATE, FERRITIN, TIBC, IRON, RETICCTPCT in the last 72 hours.  --------------------------------------------------------------------------------------------------------------- Urine analysis:    Component Value Date/Time   COLORURINE AMBER (A) 07/22/2018 1957   APPEARANCEUR TURBID (A) 07/22/2018 1957   LABSPEC 1.017 07/22/2018 1957   PHURINE 6.0 07/22/2018 1957   GLUCOSEU NEGATIVE 07/22/2018 1957   HGBUR MODERATE (A) 07/22/2018 1957   BILIRUBINUR NEGATIVE 07/22/2018 1957   KETONESUR NEGATIVE 07/22/2018 1957   PROTEINUR 100 (A) 07/22/2018  1957   NITRITE NEGATIVE 07/22/2018 1957   LEUKOCYTESUR NEGATIVE 07/22/2018 1957      Imaging Results:    Dg Chest 2 View  Result Date: 02/03/2019 CLINICAL DATA:  Chest pain EXAM: CHEST - 2 VIEW COMPARISON:  None. FINDINGS: Bibasilar areas of atelectasis. Bandlike opacity in the periphery of the left lung base could reflect subsegmental atelectasis or scarring. No consolidation, features of edema, pneumothorax, or effusion. The cardiomediastinal contours are unremarkable. No acute osseous or soft tissue abnormality. IMPRESSION: 1. Atelectasis,  otherwise no acute cardiopulmonary abnormality. 2. Bandlike opacity in the periphery of the left lung base could reflect subsegmental atelectasis or scarring. Electronically Signed   By: Lovena Le M.D.   On: 02/03/2019 20:49   Ct Angio Chest Pe W Or Wo Contrast  Result Date: 02/03/2019 CLINICAL DATA:  PE suspected, high pretest prob. Patient reports left flank and chest pain. EXAM: CT ANGIOGRAPHY CHEST WITH CONTRAST TECHNIQUE: Multidetector CT imaging of the chest was performed using the standard protocol during bolus administration of intravenous contrast. Multiplanar CT image reconstructions and MIPs were obtained to evaluate the vascular anatomy. CONTRAST:  180mL OMNIPAQUE IOHEXOL 350 MG/ML SOLN COMPARISON:  Chest radiograph earlier this day. FINDINGS: Cardiovascular: Suboptimal evaluation for pulmonary embolus. Examination is diagnostic to the distal lobar/proximal segmental level for evaluation of pulmonary embolus. No central filling defect in the pulmonary arteries. The segmental and subsegmental branches are not well assessed. Heart is normal in size. There are coronary artery calcifications. No pericardial effusion. Thoracic aorta is normal in caliber without dissection. Mediastinum/Nodes: Few prominent mediastinal and hilar lymph nodes are not enlarged by size criteria. Decompressed esophagus. No visualized thyroid nodule. Lungs/Pleura: Breathing motion artifact limits detailed assessment. Heterogeneous opacities in both lower lobes with linear and ground-glass opacities, left greater than right. Small left pleural effusion. No septal thickening or pulmonary edema. Trachea and mainstem bronchi are patent. No pulmonary mass or suspicious nodule. Upper Abdomen: Assessed on concurrent abdominopelvic CT. No acute findings in the visualized portion. Musculoskeletal: There are no acute or suspicious osseous abnormalities. Review of the MIP images confirms the above findings. IMPRESSION: 1. No central  pulmonary embolus, to the distal lobar/proximal segmental level. 2. Heterogeneous bilateral lower lobe linear and ground-glass opacities, left greater than right. Findings may be infectious or inflammatory. Small left pleural effusion. 3. Coronary artery calcifications. Electronically Signed   By: Keith Rake M.D.   On: 02/03/2019 23:37   Dg Chest Port 1 View  Result Date: 02/04/2019 CLINICAL DATA:  Increasing shortness of breath. EXAM: PORTABLE CHEST 1 VIEW COMPARISON:  Radiographs and CT yesterday. FINDINGS: Low lung volumes. Increasing streaky bibasilar opacities. Increasing peribronchial thickening. Unchanged heart size and mediastinal contours. Small left pleural effusion on CT not well seen radiographically. No pneumothorax. IMPRESSION: 1. Increasing streaky bibasilar opacities, developing pneumonia versus atelectasis. 2. Small left pleural effusion on CT not well seen radiographically. Electronically Signed   By: Keith Rake M.D.   On: 02/04/2019 03:11   Ct Renal Stone Study  Result Date: 02/03/2019 CLINICAL DATA:  38 year old male with flank pain. Concern for kidney stone. EXAM: CT ABDOMEN AND PELVIS WITHOUT CONTRAST TECHNIQUE: Multidetector CT imaging of the abdomen and pelvis was performed following the standard protocol without IV contrast. COMPARISON:  CT of the abdomen pelvis dated 07/22/2018 FINDINGS: Evaluation of this exam is limited in the absence of intravenous contrast. Lower chest: Trace left pleural effusion. Please see report for the CTA of the chest. No intra-abdominal free air or free fluid.  Hepatobiliary: Diffuse fatty infiltration of the liver. No intrahepatic biliary ductal dilatation. The gallbladder is unremarkable. Pancreas: Unremarkable. No pancreatic ductal dilatation or surrounding inflammatory changes. Spleen: Normal in size without focal abnormality. Adrenals/Urinary Tract: Adrenal glands are unremarkable. Kidneys are normal, without renal calculi, focal lesion,  or hydronephrosis. Bladder is unremarkable. Stomach/Bowel: There is sigmoid diverticulosis without active inflammatory changes. There is no bowel obstruction or active inflammation. The appendix is normal. Vascular/Lymphatic: The abdominal aorta and IVC are grossly unremarkable. No portal venous gas. There is no adenopathy. Reproductive: The prostate and seminal vesicles are grossly unremarkable. Other: None Musculoskeletal: No acute or significant osseous findings. IMPRESSION: 1. No acute intra-abdominal or pelvic pathology. No hydronephrosis or nephrolithiasis. 2. Fatty liver. 3. Sigmoid diverticulosis. No bowel obstruction or active inflammation. Normal appendix. Electronically Signed   By: Elgie Collard M.D.   On: 02/03/2019 23:28   ST at 122, nl axis, no st-t changes c/w ischemia     Assessment & Plan:    Active Problems:   Dyspnea   Dyspnea Secondary to CAP ddx Asthma/ Copd exacerbation  CAP Blood culture x2 Urine strep antigen Urine legionella antigen Rocephin 1gm iv qday Zithromax 500mg  iv qday  Asthma exacerbation Solumedrol 80mg  iv q8h Albuterol HFA  Abx as above  Tachycardia Tele Trop I q2h x2 Check cardiac echo   Chest pain (coronary artery calcifications on CT) secondary to CAP? Check cardiac echo Consider cardiology consult    DVT Prophylaxis-   Lovenox - SCDs   AM Labs Ordered, also please review Full Orders  Family Communication: Admission, patients condition and plan of care including tests being ordered have been discussed with the patient  who indicate understanding and agree with the plan and Code Status.  Code Status:  FULL CODE per patient, wife present with patient in ED,   Admission status:  /Inpatient: Based on patients clinical presentation and evaluation of above clinical data, I have made determination that patient meets Inpatient criteria at this time.  Pt has dyspnea/. Cp  secondary to CAP, pt will require iv abx, and > 2 nites stay due  to CAP, asthma exacerbation  Time spent in minutes : 70 minutes    M.D on 02/04/2019 at 4:05 AM

## 2019-02-04 NOTE — Progress Notes (Signed)
Patient seen and evaluated, chart reviewed, please see EMR for updated orders. Please see full H&P dictated by admitting physician Dr. Maudie Mercury  for same date of service.   Brief Summarry 38 y.o. morbidly obese but otherwise healthy male admitted with respiratory symptoms with a presumptive diagnosis of bilateral pneumonia  - A/p  CAP/Bilateral pneumonia--continue Rocephin/azithromycin pending blood cultures, streptococcal antigen negative, Legionella pending, WBC down to 12.5 from 14.2, -Continue bronchodilators and mucolytics -Lactic acid is not elevated -COVID-19 negative x2  -Acute hypoxic respiratory failure-secondary to #1 above echo with EF of 55 to 60% -CTA chest without PE -c/n Oxygen via [place] at 2L/min -As above in #1  -Morbid obesity BMI 63--OVFI complicates overall care, suspect some degree of OSA--may use CPAP nightly  H/o Asthma--suspect some asthma exacerbation secondary to pneumonia, IV Solu-Medrol as ordered

## 2019-02-04 NOTE — Progress Notes (Signed)
*  PRELIMINARY RESULTS* Echocardiogram 2D Echocardiogram has been performed.  Leavy Cella 02/04/2019, 1:32 PM

## 2019-02-05 DIAGNOSIS — R739 Hyperglycemia, unspecified: Secondary | ICD-10-CM | POA: Diagnosis not present

## 2019-02-05 DIAGNOSIS — D72829 Elevated white blood cell count, unspecified: Secondary | ICD-10-CM

## 2019-02-05 DIAGNOSIS — J9601 Acute respiratory failure with hypoxia: Secondary | ICD-10-CM | POA: Diagnosis present

## 2019-02-05 LAB — CBC
HCT: 44.4 % (ref 39.0–52.0)
Hemoglobin: 14.9 g/dL (ref 13.0–17.0)
MCH: 31.2 pg (ref 26.0–34.0)
MCHC: 33.6 g/dL (ref 30.0–36.0)
MCV: 92.9 fL (ref 80.0–100.0)
Platelets: 323 10*3/uL (ref 150–400)
RBC: 4.78 MIL/uL (ref 4.22–5.81)
RDW: 13.4 % (ref 11.5–15.5)
WBC: 19.4 10*3/uL — ABNORMAL HIGH (ref 4.0–10.5)
nRBC: 0 % (ref 0.0–0.2)

## 2019-02-05 LAB — BASIC METABOLIC PANEL
Anion gap: 8 (ref 5–15)
BUN: 15 mg/dL (ref 6–20)
CO2: 22 mmol/L (ref 22–32)
Calcium: 9.1 mg/dL (ref 8.9–10.3)
Chloride: 104 mmol/L (ref 98–111)
Creatinine, Ser: 0.76 mg/dL (ref 0.61–1.24)
GFR calc Af Amer: >60 mL/min (ref >60)
GFR calc non Af Amer: >60 mL/min (ref >60)
Glucose, Bld: 228 mg/dL — ABNORMAL HIGH (ref 70–99)
Potassium: 4.4 mmol/L (ref 3.5–5.1)
Sodium: 134 mmol/L — ABNORMAL LOW (ref 135–145)

## 2019-02-05 LAB — MRSA PCR SCREENING: MRSA by PCR: NEGATIVE

## 2019-02-05 LAB — GLUCOSE, CAPILLARY: Glucose-Capillary: 380 mg/dL — ABNORMAL HIGH (ref 70–99)

## 2019-02-05 MED ORDER — NICOTINE 21 MG/24HR TD PT24
21.0000 mg | MEDICATED_PATCH | Freq: Every day | TRANSDERMAL | Status: DC
  Administered 2019-02-05 – 2019-02-07 (×3): 21 mg via TRANSDERMAL
  Filled 2019-02-05 (×3): qty 1

## 2019-02-05 MED ORDER — INSULIN ASPART 100 UNIT/ML ~~LOC~~ SOLN
0.0000 [IU] | Freq: Three times a day (TID) | SUBCUTANEOUS | Status: DC
  Administered 2019-02-06: 7 [IU] via SUBCUTANEOUS
  Administered 2019-02-06: 17:00:00 20 [IU] via SUBCUTANEOUS
  Administered 2019-02-06: 11 [IU] via SUBCUTANEOUS
  Administered 2019-02-07 (×2): 3 [IU] via SUBCUTANEOUS

## 2019-02-05 MED ORDER — ORAL CARE MOUTH RINSE
15.0000 mL | Freq: Two times a day (BID) | OROMUCOSAL | Status: DC
  Administered 2019-02-05 – 2019-02-07 (×6): 15 mL via OROMUCOSAL

## 2019-02-05 MED ORDER — INSULIN ASPART 100 UNIT/ML ~~LOC~~ SOLN
0.0000 [IU] | Freq: Every day | SUBCUTANEOUS | Status: DC
  Administered 2019-02-05: 22:00:00 5 [IU] via SUBCUTANEOUS
  Administered 2019-02-06: 3 [IU] via SUBCUTANEOUS

## 2019-02-05 NOTE — Progress Notes (Signed)
PROGRESS NOTE    Edward [place]  VWU:[id number] DOB: Dec 16, 1980 DOA: 02/03/2019 PCP: System, Pcp Not In    Brief Narrative:  38 year old male admitted to the hospital with chest pain, shortness of breath and found to have community-acquired pneumonia with acute respiratory failure.  He was admitted for intravenous antibiotics and supplemental oxygen.   Assessment & Plan:   Active Problems:   Dyspnea   CAP (community acquired pneumonia)   Chest pain   PNA (pneumonia)   Acute respiratory failure with hypoxia (HCC)   Obesity, Class III, BMI 40-49.9 (morbid obesity) (HCC)   Hyperglycemia   Leukocytosis   1. Acute respiratory failure with hypoxia.  Secondary to community-acquired pneumonia.  Still requiring supplemental oxygen at 3 L.  Continue to wean down as tolerated.  CTA chest is negative for pulmonary embolus.  COVID-19 negative 2. Community-acquired pneumonia.  Continue on ceftriaxone and azithromycin.  Pneumococcal antigen is negative.  He is also been started on steroids. 3. History of asthma.  Started on steroids as well as bronchodilators. 4. Hyperglycemia.  Related to steroids.  Start checking serial CBGs. 5. Leukocytosis.  Secondary to steroids.  He is afebrile 6. Morbid obesity.   DVT prophylaxis: Lovenox Code Status: Full code Family Communication: None present Disposition Plan: Discharge home once respiratory status has improved   Consultants:     Procedures:  Echo: 1. Left ventricular ejection fraction, by visual estimation, is 55 to 60%. The left ventricle has normal function. There is mildly increased left ventricular hypertrophy.  2. Global right ventricle has normal systolic function.The right ventricular size is normal. No increase in right ventricular wall thickness.  3. Left atrial size was mildly dilated.  4. Right atrial size was normal.  5. The mitral valve is normal in structure. No evidence of mitral valve regurgitation. No evidence of mitral  stenosis.  6. The tricuspid valve is normal in structure. Tricuspid valve regurgitation was not visualized by color flow Doppler.  7. The aortic valve is tricuspid Aortic valve regurgitation was not visualized by color flow Doppler. Structurally normal aortic valve, with no evidence of sclerosis or stenosis.  8. The pulmonic valve was not well visualized. Pulmonic valve regurgitation is not visualized by color flow Doppler.   9. The inferior vena cava is normal in size with greater than 50% respiratory variability, suggesting right atrial pressure of 3 mmHg.  Antimicrobials:   Ceftriaxone 10/2 >  Azithromycin 10/2 >   Subjective: Has productive cough.  Still requiring significant supplemental oxygen.  Respiratory status has not returned to baseline.  Objective: Vitals:   02/05/19 1620 02/05/19 1650 02/05/19 1700 02/05/19 1800  BP:    133/87  Pulse:   81 (!) 109  Resp:   (!) 27 (!) 24  Temp: 98.5 F (36.9 C)     TempSrc: Oral     SpO2:  97% 94% 94%  Weight:      Height:        Intake/Output Summary (Last 24 hours) at 02/05/2019 1903 Last data filed at 02/05/2019 0430 Gross per 24 hour  Intake 385.32 ml  Output --  Net 385.32 ml   Filed Weights   02/03/19 1854 02/04/19 2338 02/05/19 0404  Weight: (!) 157.4 kg (!) 151 kg (!) 150.7 kg    Examination:  General exam: Appears calm and comfortable  Respiratory system: Clear to auscultation. Respiratory effort normal. Cardiovascular system: S1 & S2 heard, RRR. No JVD, murmurs, rubs, gallops or clicks. No pedal edema. Gastrointestinal system:  Abdomen is nondistended, soft and nontender. No organomegaly or masses felt. Normal bowel sounds heard. Central nervous system: Alert and oriented. No focal neurological deficits. Extremities: Symmetric 5 x 5 power. Skin: No rashes, lesions or ulcers Psychiatry: Judgement and insight appear normal. Mood & affect appropriate.     Data Reviewed: I have personally reviewed following labs  and imaging studies  CBC: Recent Labs  Lab 02/03/19 2101 02/04/19 0132 02/05/19 0456  WBC 14.2* 12.5* 19.4*  HGB 16.9 15.0 14.9  HCT 51.7 45.0 44.4  MCV 92.7 93.4 92.9  PLT 341 303 323   Basic Metabolic Panel: Recent Labs  Lab 02/03/19 2101 02/04/19 0132 02/05/19 0456  NA 136 134* 134*  K 4.3 4.0 4.4  CL 100 101 104  CO2 24 24 22   GLUCOSE 128* 151* 228*  BUN 12 11 15   CREATININE 0.94 0.96 0.76  CALCIUM 9.6 8.5* 9.1   GFR: Estimated Creatinine Clearance: 185.4 mL/min (by C-G formula based on SCr of 0.76 mg/dL). Liver Function Tests: Recent Labs  Lab 02/04/19 0132  AST 17   19  ALT 26   30  ALKPHOS 89   101  BILITOT 0.6   0.6  PROT 7.1   7.9  ALBUMIN 3.8   4.2   No results for input(s): LIPASE, AMYLASE in the last 168 hours. No results for input(s): AMMONIA in the last 168 hours. Coagulation Profile: No results for input(s): INR, PROTIME in the last 168 hours. Cardiac Enzymes: No results for input(s): CKTOTAL, CKMB, CKMBINDEX, TROPONINI in the last 168 hours. BNP (last 3 results) No results for input(s): PROBNP in the last 8760 hours. HbA1C: No results for input(s): HGBA1C in the last 72 hours. CBG: No results for input(s): GLUCAP in the last 168 hours. Lipid Profile: No results for input(s): CHOL, HDL, LDLCALC, TRIG, CHOLHDL, LDLDIRECT in the last 72 hours. Thyroid Function Tests: Recent Labs    02/04/19 0132  TSH 3.559   Anemia Panel: No results for input(s): VITAMINB12, FOLATE, FERRITIN, TIBC, IRON, RETICCTPCT in the last 72 hours. Sepsis Labs: Recent Labs  Lab 02/03/19 2101 02/04/19 0132  LATICACIDVEN 1.5 1.3    Recent Results (from the past 240 hour(s))  SARS Coronavirus 2 [facility](Hospital order, [facility]) Nasopharyngeal Nasopharyngeal Swab     Status: None   Collection Time: 02/04/19 12:04 AM   Specimen: Nasopharyngeal Swab  Result Value Ref Range Status   SARS Coronavirus 2 NEGATIVE NEGATIVE Final    Comment:  (NOTE) If result is NEGATIVE SARS-CoV-2 target nucleic acids are NOT DETECTED. The SARS-CoV-2 RNA is generally detectable in upper and lower  respiratory specimens during the acute phase of infection. The lowest  concentration of SARS-CoV-2 viral copies this assay can detect is 250  copies / mL. A negative result does not preclude SARS-CoV-2 infection  and should not be used as the sole basis for treatment or other  patient management decisions.  A negative result may occur with  improper specimen collection / handling, submission of specimen other  than nasopharyngeal swab, presence of viral mutation(s) within the  areas targeted by this assay, and inadequate number of viral copies  (<250 copies / mL). A negative result must be combined with clinical  observations, patient history, and epidemiological information. If result is POSITIVE SARS-CoV-2 target nucleic acids are DETECTED. The SARS-CoV-2 RNA is generally detectable in upper and lower  respiratory specimens dur ing the acute phase of infection.  Positive  results are indicative of  active infection with SARS-CoV-2.  Clinical  correlation with patient history and other diagnostic information is  necessary to determine patient infection status.  Positive results do  not rule out bacterial infection or co-infection with other viruses. If result is PRESUMPTIVE POSTIVE SARS-CoV-2 nucleic acids MAY BE PRESENT.   A presumptive positive result was obtained on the submitted specimen  and confirmed on repeat testing.  While 2019 novel coronavirus  (SARS-CoV-2) nucleic acids may be present in the submitted sample  additional confirmatory testing may be necessary for epidemiological  and / or clinical management purposes  to differentiate between  SARS-CoV-2 and other Sarbecovirus currently known to infect humans.  If clinically indicated additional testing with an alternate test  methodology [phone]) is advised. The SARS-CoV-2 RNA is  generally  detectable in upper and lower respiratory sp ecimens during the acute  phase of infection. The expected result is Negative. Fact Sheet for Patients:  BoilerBrush.com.cy Fact Sheet for Healthcare Providers: https://pope.com/ This test is not yet approved or cleared by the [place] FDA and has been authorized for detection and/or diagnosis of SARS-CoV-2 by FDA under an Emergency Use Authorization (EUA).  This EUA will remain in effect (meaning this test can be used) for the duration of the COVID-19 declaration under Section 564(b)(1) of the Act, 21 U.S.C. section 360bbb-3(b)(1), unless the authorization is terminated or revoked sooner. Performed at [facility], [address], [place], [place] [address]   Blood Culture (routine x 2)     Status: None (Preliminary result)   Collection Time: 02/04/19  1:30 AM   Specimen: BLOOD  Result Value Ref Range Status   Specimen Description BLOOD BLOOD RIGHT WRIST  Final   Special Requests   Final    BOTTLES DRAWN AEROBIC AND ANAEROBIC Blood Culture adequate volume   Culture   Final    NO GROWTH 1 DAY Performed at [facility], [address] [place], [place] [address]    Report Status PENDING  Incomplete  Blood Culture (routine x 2)     Status: None (Preliminary result)   Collection Time: 02/04/19  1:32 AM   Specimen: BLOOD  Result Value Ref Range Status   Specimen Description BLOOD LEFT ANTECUBITAL  Final   Special Requests   Final    BOTTLES DRAWN AEROBIC AND ANAEROBIC Blood Culture adequate volume   Culture   Final    NO GROWTH 1 DAY Performed at [facility], [address], [place], [place] [address]    Report Status PENDING  Incomplete  SARS Coronavirus 2 [facility] order, Performed in [facility] hospital lab) Nasopharyngeal Nasopharyngeal Swab     Status: None   Collection Time: 02/04/19  3:25 AM   Specimen: Nasopharyngeal Swab  Result Value Ref Range Status   SARS  Coronavirus 2 NEGATIVE NEGATIVE Final    Comment: (NOTE) If result is NEGATIVE SARS-CoV-2 target nucleic acids are NOT DETECTED. The SARS-CoV-2 RNA is generally detectable in upper and lower  respiratory specimens during the acute phase of infection. The lowest  concentration of SARS-CoV-2 viral copies this assay can detect is 250  copies / mL. A negative result does not preclude SARS-CoV-2 infection  and should not be used as the sole basis for treatment or other  patient management decisions.  A negative result may occur with  improper specimen collection / handling, submission of specimen other  than nasopharyngeal swab, presence of viral mutation(s) within the  areas targeted by this assay, and inadequate number of viral copies  (<  250 copies / mL). A negative result must be combined with clinical  observations, patient history, and epidemiological information. If result is POSITIVE SARS-CoV-2 target nucleic acids are DETECTED. The SARS-CoV-2 RNA is generally detectable in upper and lower  respiratory specimens dur ing the acute phase of infection.  Positive  results are indicative of active infection with SARS-CoV-2.  Clinical  correlation with patient history and other diagnostic information is  necessary to determine patient infection status.  Positive results do  not rule out bacterial infection or co-infection with other viruses. If result is PRESUMPTIVE POSTIVE SARS-CoV-2 nucleic acids MAY BE PRESENT.   A presumptive positive result was obtained on the submitted specimen  and confirmed on repeat testing.  While 2019 novel coronavirus  (SARS-CoV-2) nucleic acids may be present in the submitted sample  additional confirmatory testing may be necessary for epidemiological  and / or clinical management purposes  to differentiate between  SARS-CoV-2 and other Sarbecovirus currently known to infect humans.  If clinically indicated additional testing with an alternate test    methodology [phone]) is advised. The SARS-CoV-2 RNA is generally  detectable in upper and lower respiratory sp ecimens during the acute  phase of infection. The expected result is Negative. Fact Sheet for Patients:  BoilerBrush.com.cy Fact Sheet for Healthcare Providers: https://pope.com/ This test is not yet approved or cleared by the [place] FDA and has been authorized for detection and/or diagnosis of SARS-CoV-2 by FDA under an Emergency Use Authorization (EUA).  This EUA will remain in effect (meaning this test can be used) for the duration of the COVID-19 declaration under Section 564(b)(1) of the Act, 21 U.S.C. section 360bbb-3(b)(1), unless the authorization is terminated or revoked sooner. Performed at [facility], [address], [place], [place] [address]   MRSA PCR Screening     Status: None   Collection Time: 02/04/19 11:30 PM   Specimen: Nasal Mucosa; Nasopharyngeal  Result Value Ref Range Status   MRSA by PCR NEGATIVE NEGATIVE Final    Comment:        The GeneXpert MRSA Assay (FDA approved for NASAL specimens only), is one component of a comprehensive MRSA colonization surveillance program. It is not intended to diagnose MRSA infection nor to guide or monitor treatment for MRSA infections. Performed at [facility], [address], [place], [place] [address]          Radiology Studies: Dg Chest 2 View  Result Date: 02/03/2019 CLINICAL DATA:  Chest pain EXAM: CHEST - 2 VIEW COMPARISON:  None. FINDINGS: Bibasilar areas of atelectasis. Bandlike opacity in the periphery of the left lung base could reflect subsegmental atelectasis or scarring. No consolidation, features of edema, pneumothorax, or effusion. The cardiomediastinal contours are unremarkable. No acute osseous or soft tissue abnormality. IMPRESSION: 1. Atelectasis, otherwise no acute cardiopulmonary abnormality. 2. Bandlike opacity in the periphery  of the left lung base could reflect subsegmental atelectasis or scarring. Electronically Signed   By: Kreg Shropshire M.D.   On: 02/03/2019 20:49   Ct Angio Chest Pe W Or Wo Contrast  Result Date: 02/03/2019 CLINICAL DATA:  PE suspected, high pretest prob. Patient reports left flank and chest pain. EXAM: CT ANGIOGRAPHY CHEST WITH CONTRAST TECHNIQUE: Multidetector CT imaging of the chest was performed using the standard protocol during bolus administration of intravenous contrast. Multiplanar CT image reconstructions and MIPs were obtained to evaluate the vascular anatomy. CONTRAST:  OMNIPAQUE IOHEXOL 350 MG/ML SOLN COMPARISON:  Chest radiograph earlier this day. FINDINGS: Cardiovascular: Suboptimal evaluation  for pulmonary embolus. Examination is diagnostic to the distal lobar/proximal segmental level for evaluation of pulmonary embolus. No central filling defect in the pulmonary arteries. The segmental and subsegmental branches are not well assessed. Heart is normal in size. There are coronary artery calcifications. No pericardial effusion. Thoracic aorta is normal in caliber without dissection. Mediastinum/Nodes: Few prominent mediastinal and hilar lymph nodes are not enlarged by size criteria. Decompressed esophagus. No visualized thyroid nodule. Lungs/Pleura: Breathing motion artifact limits detailed assessment. Heterogeneous opacities in both lower lobes with linear and ground-glass opacities, left greater than right. Small left pleural effusion. No septal thickening or pulmonary edema. Trachea and mainstem bronchi are patent. No pulmonary mass or suspicious nodule. Upper Abdomen: Assessed on concurrent abdominopelvic CT. No acute findings in the visualized portion. Musculoskeletal: There are no acute or suspicious osseous abnormalities. Review of the MIP images confirms the above findings. IMPRESSION: 1. No central pulmonary embolus, to the distal lobar/proximal segmental level. 2. Heterogeneous  bilateral lower lobe linear and ground-glass opacities, left greater than right. Findings may be infectious or inflammatory. Small left pleural effusion. 3. Coronary artery calcifications. Electronically Signed   By: Narda Rutherford M.D.   On: 02/03/2019 23:37   Dg Chest Port 1 View  Result Date: 02/04/2019 CLINICAL DATA:  Increasing shortness of breath. EXAM: PORTABLE CHEST 1 VIEW COMPARISON:  Radiographs and CT yesterday. FINDINGS: Low lung volumes. Increasing streaky bibasilar opacities. Increasing peribronchial thickening. Unchanged heart size and mediastinal contours. Small left pleural effusion on CT not well seen radiographically. No pneumothorax. IMPRESSION: 1. Increasing streaky bibasilar opacities, developing pneumonia versus atelectasis. 2. Small left pleural effusion on CT not well seen radiographically. Electronically Signed   By: Narda Rutherford M.D.   On: 02/04/2019 03:11   Ct Renal Stone Study  Result Date: 02/03/2019 CLINICAL DATA:  38 year old male with flank pain. Concern for kidney stone. EXAM: CT ABDOMEN AND PELVIS WITHOUT CONTRAST TECHNIQUE: Multidetector CT imaging of the abdomen and pelvis was performed following the standard protocol without IV contrast. COMPARISON:  CT of the abdomen pelvis dated 07/22/2018 FINDINGS: Evaluation of this exam is limited in the absence of intravenous contrast. Lower chest: Trace left pleural effusion. Please see report for the CTA of the chest. No intra-abdominal free air or free fluid. Hepatobiliary: Diffuse fatty infiltration of the liver. No intrahepatic biliary ductal dilatation. The gallbladder is unremarkable. Pancreas: Unremarkable. No pancreatic ductal dilatation or surrounding inflammatory changes. Spleen: Normal in size without focal abnormality. Adrenals/Urinary Tract: Adrenal glands are unremarkable. Kidneys are normal, without renal calculi, focal lesion, or hydronephrosis. Bladder is unremarkable. Stomach/Bowel: There is sigmoid  diverticulosis without active inflammatory changes. There is no bowel obstruction or active inflammation. The appendix is normal. Vascular/Lymphatic: The abdominal aorta and IVC are grossly unremarkable. No portal venous gas. There is no adenopathy. Reproductive: The prostate and seminal vesicles are grossly unremarkable. Other: None Musculoskeletal: No acute or significant osseous findings. IMPRESSION: 1. No acute intra-abdominal or pelvic pathology. No hydronephrosis or nephrolithiasis. 2. Fatty liver. 3. Sigmoid diverticulosis. No bowel obstruction or active inflammation. Normal appendix. Electronically Signed   By: Elgie Collard M.D.   On: 02/03/2019 23:28        Scheduled Meds:  albuterol  3 mL Inhalation QID   Chlorhexidine Gluconate Cloth  6 each Topical Daily   enoxaparin (LOVENOX) injection  80 mg Subcutaneous Q24H   guaiFENesin  600 mg Oral BID   [START ON 02/06/2019] insulin aspart  0-20 Units Subcutaneous TID WC   insulin aspart  0-5 Units Subcutaneous QHS   mouth rinse  15 mL Mouth Rinse BID   methylPREDNISolone (SOLU-MEDROL) injection  40 mg Intravenous Q8H   nicotine  21 mg Transdermal Daily   sodium chloride flush  3 mL Intravenous Once   sodium chloride flush  3 mL Intravenous Q12H   Continuous Infusions:  sodium chloride Stopped (02/05/19 0430)   azithromycin 500 mg (02/05/19 0118)   cefTRIAXone (ROCEPHIN)  IV 2 g (02/05/19 0030)     LOS: 1 day    Time spent: 71mins    Kathie Dike, MD [facility]   If 7PM-7AM, please contact night-coverage [email or web address]  02/05/2019, 7:03 PM

## 2019-02-06 LAB — GLUCOSE, CAPILLARY
Glucose-Capillary: 242 mg/dL — ABNORMAL HIGH (ref 70–99)
Glucose-Capillary: 259 mg/dL — ABNORMAL HIGH (ref 70–99)
Glucose-Capillary: 387 mg/dL — ABNORMAL HIGH (ref 70–99)
Glucose-Capillary: 256 mg/dL — ABNORMAL HIGH (ref 70–99)

## 2019-02-06 LAB — HEMOGLOBIN A1C
Hgb A1c MFr Bld: 7.3 % — ABNORMAL HIGH (ref 4.8–5.6)
Mean Plasma Glucose: 162.81 mg/dL

## 2019-02-06 MED ORDER — SALINE SPRAY 0.65 % NA SOLN
1.0000 | NASAL | Status: DC | PRN
  Administered 2019-02-06: 1 via NASAL
  Filled 2019-02-06: qty 44

## 2019-02-06 MED ORDER — INSULIN GLARGINE 100 UNIT/ML ~~LOC~~ SOLN
10.0000 [IU] | Freq: Every day | SUBCUTANEOUS | Status: DC
  Administered 2019-02-06: 10 [IU] via SUBCUTANEOUS
  Filled 2019-02-06 (×2): qty 0.1

## 2019-02-06 NOTE — Progress Notes (Signed)
PROGRESS NOTE    Edward Booth  JJO:[id number] DOB: [birth year] DOA: 02/03/2019 PCP: System, Pcp Not In    Brief Narrative:  38 year old male admitted to the hospital with chest pain, shortness of breath and found to have community-acquired pneumonia with acute respiratory failure.  He was admitted for intravenous antibiotics and supplemental oxygen.   Assessment & Plan:   Active Problems:   Dyspnea   CAP (community acquired pneumonia)   Chest pain   PNA (pneumonia)   Acute respiratory failure with hypoxia (HCC)   Obesity, Class III, BMI 40-49.9 (morbid obesity) (HCC)   Hyperglycemia   Leukocytosis   1. Acute respiratory failure with hypoxia.  Secondary to community-acquired pneumonia.  Still requiring supplemental oxygen at 2 L.  Continue to wean down as tolerated.  CTA chest is negative for pulmonary embolus.  COVID-19 negative 2. Community-acquired pneumonia.  Continue on ceftriaxone and azithromycin.  Pneumococcal antigen is negative.  Legionella antigen in process.  Since there is no wheezing, will discontinue steroids. 3. History of asthma.  Initially started on steroids, but since there is no wheezing, will discontinue further steroids. 4. Hyperglycemia.  Related to steroids.  CBGs remain elevated.  He has does not report a history of diabetes.  A1c 7.3.  Will likely benefit from starting on metformin at discharge.  Only discussed with patient prior to discharge.  We will continue sliding scale insulin and started on Lantus while in the hospital. 5. Leukocytosis.  Secondary to steroids.  He is afebrile 6. Morbid obesity.   DVT prophylaxis: Lovenox Code Status: Full code Family Communication: None present Disposition Plan: Discharge home once respiratory status has improved   Consultants:     Procedures:  Echo: 1. Left ventricular ejection fraction, by visual estimation, is 55 to 60%. The left ventricle has normal function. There is mildly increased left ventricular  hypertrophy.  2. Global right ventricle has normal systolic function.The right ventricular size is normal. No increase in right ventricular wall thickness.  3. Left atrial size was mildly dilated.  4. Right atrial size was normal.  5. The mitral valve is normal in structure. No evidence of mitral valve regurgitation. No evidence of mitral stenosis.  6. The tricuspid valve is normal in structure. Tricuspid valve regurgitation was not visualized by color flow Doppler.  7. The aortic valve is tricuspid Aortic valve regurgitation was not visualized by color flow Doppler. Structurally normal aortic valve, with no evidence of sclerosis or stenosis.  8. The pulmonic valve was not well visualized. Pulmonic valve regurgitation is not visualized by color flow Doppler.   9. The inferior vena cava is normal in size with greater than 50% respiratory variability, suggesting right atrial pressure of 3 mmHg.  Antimicrobials:   Ceftriaxone 10/2 >  Azithromycin 10/2 >   Subjective: Breathing is slowly improving.  Still feels short of breath on exertion.  Has productive cough.  Still requiring supplemental oxygen.  Objective: Vitals:   02/06/19 1614 02/06/19 1621 02/06/19 1629 02/06/19 1659  BP:  (!) 158/87    Pulse:      Resp:  (!) 23  (!) 21  Temp:      TempSrc: Oral     SpO2:   96%   Weight:      Height:        Intake/Output Summary (Last 24 hours) at 02/06/2019 1813 Last data filed at 02/06/2019 0600 Gross per 24 hour  Intake 363.41 ml  Output 700 ml  Net -336.59 ml  Filed Weights   02/04/19 2338 02/05/19 0404 02/06/19 0500  Weight: (!) 151 kg (!) 150.7 kg (!) 151 kg    Examination:  General exam: Alert, awake, oriented x 3 Respiratory system: Clear to auscultation. Respiratory effort normal. Cardiovascular system:RRR. No murmurs, rubs, gallops. Gastrointestinal system: Abdomen is nondistended, soft and nontender. No organomegaly or masses felt. Normal bowel sounds heard. Central  nervous system: Alert and oriented. No focal neurological deficits. Extremities: No C/C/E, +pedal pulses Skin: No rashes, lesions or ulcers Psychiatry: Judgement and insight appear normal. Mood & affect appropriate.      Data Reviewed: I have personally reviewed following labs and imaging studies  CBC: Recent Labs  Lab 02/03/19 2101 02/04/19 0132 02/05/19 0456  WBC 14.2* 12.5* 19.4*  HGB 16.9 15.0 14.9  HCT 51.7 45.0 44.4  MCV 92.7 93.4 92.9  PLT 341 303 323   Basic Metabolic Panel: Recent Labs  Lab 02/03/19 2101 02/04/19 0132 02/05/19 0456  NA 136 134* 134*  K 4.3 4.0 4.4  CL 100 101 104  CO2 24 24 22   GLUCOSE 128* 151* 228*  BUN 12 11 15   CREATININE 0.94 0.96 0.76  CALCIUM 9.6 8.5* 9.1   GFR: Estimated Creatinine Clearance: 185.8 mL/min (by C-G formula based on SCr of 0.76 mg/dL). Liver Function Tests: Recent Labs  Lab 02/04/19 0132  AST 17  19  ALT 26  30  ALKPHOS 89  101  BILITOT 0.6  0.6  PROT 7.1  7.9  ALBUMIN 3.8  4.2   No results for input(s): LIPASE, AMYLASE in the last 168 hours. No results for input(s): AMMONIA in the last 168 hours. Coagulation Profile: No results for input(s): INR, PROTIME in the last 168 hours. Cardiac Enzymes: No results for input(s): CKTOTAL, CKMB, CKMBINDEX, TROPONINI in the last 168 hours. BNP (last 3 results) No results for input(s): PROBNP in the last 8760 hours. HbA1C: Recent Labs    02/05/19 0456  HGBA1C 7.3*   CBG: Recent Labs  Lab 02/05/19 2124 02/06/19 0746 02/06/19 1116 02/06/19 1615  GLUCAP 380* 242* 259* 387*   Lipid Profile: No results for input(s): CHOL, HDL, LDLCALC, TRIG, CHOLHDL, LDLDIRECT in the last 72 hours. Thyroid Function Tests: Recent Labs    02/04/19 0132  TSH 3.559   Anemia Panel: No results for input(s): VITAMINB12, FOLATE, FERRITIN, TIBC, IRON, RETICCTPCT in the last 72 hours. Sepsis Labs: Recent Labs  Lab 02/03/19 2101 02/04/19 0132  LATICACIDVEN 1.5 1.3     Recent Results (from the past 240 hour(s))  SARS Coronavirus 2 [facility](Hospital order, Per[facility]) Nasopharyngeal Nasopharyngeal Swab     Status: None   Collection Time: 02/04/19 12:04 AM   Specimen: Nasopharyngeal Swab  Result Value Ref Range Status   SARS Coronavirus 2 NEGATIVE NEGATIVE Final    Comment: (NOTE) If result is NEGATIVE SARS-CoV-2 target nucleic acids are NOT DETECTED. The SARS-CoV-2 RNA is generally detectable in upper and lower  respiratory specimens during the acute phase of infection. The lowest  concentration of SARS-CoV-2 viral copies this assay can detect is 250  copies / mL. A negative result does not preclude SARS-CoV-2 infection  and should not be used as the sole basis for treatment or other  patient management decisions.  A negative result may occur with  improper specimen collection / handling, submission of specimen other  than nasopharyngeal swab, presence of viral mutation(s) within the  areas targeted by this assay, and inadequate number of viral copies  (<250  copies / mL). A negative result must be combined with clinical  observations, patient history, and epidemiological information. If result is POSITIVE SARS-CoV-2 target nucleic acids are DETECTED. The SARS-CoV-2 RNA is generally detectable in upper and lower  respiratory specimens dur ing the acute phase of infection.  Positive  results are indicative of active infection with SARS-CoV-2.  Clinical  correlation with patient history and other diagnostic information is  necessary to determine patient infection status.  Positive results do  not rule out bacterial infection or co-infection with other viruses. If result is PRESUMPTIVE POSTIVE SARS-CoV-2 nucleic acids MAY BE PRESENT.   A presumptive positive result was obtained on the submitted specimen  and confirmed on repeat testing.  While 2019 novel coronavirus  (SARS-CoV-2) nucleic acids may be present in the submitted sample   additional confirmatory testing may be necessary for epidemiological  and / or clinical management purposes  to differentiate between  SARS-CoV-2 and other Sarbecovirus currently known to infect humans.  If clinically indicated additional testing with an alternate test  methodology [phone]) is advised. The SARS-CoV-2 RNA is generally  detectable in upper and lower respiratory sp ecimens during the acute  phase of infection. The expected result is Negative. Fact Sheet for Patients:  BoilerBrush.com.cy Fact Sheet for Healthcare Providers: https://pope.com/ This test is not yet approved or cleared by the [place] FDA and has been authorized for detection and/or diagnosis of SARS-CoV-2 by FDA under an Emergency Use Authorization (EUA).  This EUA will remain in effect (meaning this test can be used) for the duration of the COVID-19 declaration under Section 564(b)(1) of the Act, 21 U.S.C. section 360bbb-3(b)(1), unless the authorization is terminated or revoked sooner. Performed at [facility], [address], [place], [place] [address]   Blood Culture (routine x 2)     Status: None (Preliminary result)   Collection Time: 02/04/19  1:30 AM   Specimen: BLOOD  Result Value Ref Range Status   Specimen Description BLOOD BLOOD RIGHT WRIST  Final   Special Requests   Final    BOTTLES DRAWN AEROBIC AND ANAEROBIC Blood Culture adequate volume   Culture   Final    NO GROWTH 2 DAYS Performed at [facility], [address], [place], [place] [address]    Report Status PENDING  Incomplete  Blood Culture (routine x 2)     Status: None (Preliminary result)   Collection Time: 02/04/19  1:32 AM   Specimen: BLOOD  Result Value Ref Range Status   Specimen Description BLOOD LEFT ANTECUBITAL  Final   Special Requests   Final    BOTTLES DRAWN AEROBIC AND ANAEROBIC Blood Culture adequate volume   Culture   Final    NO GROWTH 2 DAYS Performed at  [facility], [address], [place], [place] [address]    Report Status PENDING  Incomplete  SARS Coronavirus 2 [facility] order, Performed in [facility] hospital lab) Nasopharyngeal Nasopharyngeal Swab     Status: None   Collection Time: 02/04/19  3:25 AM   Specimen: Nasopharyngeal Swab  Result Value Ref Range Status   SARS Coronavirus 2 NEGATIVE NEGATIVE Final    Comment: (NOTE) If result is NEGATIVE SARS-CoV-2 target nucleic acids are NOT DETECTED. The SARS-CoV-2 RNA is generally detectable in upper and lower  respiratory specimens during the acute phase of infection. The lowest  concentration of SARS-CoV-2 viral copies this assay can detect is 250  copies / mL. A negative result does not preclude SARS-CoV-2 infection  and  should not be used as the sole basis for treatment or other  patient management decisions.  A negative result may occur with  improper specimen collection / handling, submission of specimen other  than nasopharyngeal swab, presence of viral mutation(s) within the  areas targeted by this assay, and inadequate number of viral copies  (<250 copies / mL). A negative result must be combined with clinical  observations, patient history, and epidemiological information. If result is POSITIVE SARS-CoV-2 target nucleic acids are DETECTED. The SARS-CoV-2 RNA is generally detectable in upper and lower  respiratory specimens dur ing the acute phase of infection.  Positive  results are indicative of active infection with SARS-CoV-2.  Clinical  correlation with patient history and other diagnostic information is  necessary to determine patient infection status.  Positive results do  not rule out bacterial infection or co-infection with other viruses. If result is PRESUMPTIVE POSTIVE SARS-CoV-2 nucleic acids MAY BE PRESENT.   A presumptive positive result was obtained on the submitted specimen  and confirmed on repeat testing.  While 2019 novel coronavirus  (SARS-CoV-2)  nucleic acids may be present in the submitted sample  additional confirmatory testing may be necessary for epidemiological  and / or clinical management purposes  to differentiate between  SARS-CoV-2 and other Sarbecovirus currently known to infect humans.  If clinically indicated additional testing with an alternate test  methodology [phone]) is advised. The SARS-CoV-2 RNA is generally  detectable in upper and lower respiratory sp ecimens during the acute  phase of infection. The expected result is Negative. Fact Sheet for Patients:  StrictlyIdeas.no Fact Sheet for Healthcare Providers: BankingDealers.co.za This test is not yet approved or cleared by the [place] FDA and has been authorized for detection and/or diagnosis of SARS-CoV-2 by FDA under an Emergency Use Authorization (EUA).  This EUA will remain in effect (meaning this test can be used) for the duration of the COVID-19 declaration under Section 564(b)(1) of the Act, 21 U.S.C. section 360bbb-3(b)(1), unless the authorization is terminated or revoked sooner. Performed at [facility], [address] [place], [place] [address]   MRSA PCR Screening     Status: None   Collection Time: 02/04/19 11:30 PM   Specimen: Nasal Mucosa; Nasopharyngeal  Result Value Ref Range Status   MRSA by PCR NEGATIVE NEGATIVE Final    Comment:        The GeneXpert MRSA Assay (FDA approved for NASAL specimens only), is one component of a comprehensive MRSA colonization surveillance program. It is not intended to diagnose MRSA infection nor to guide or monitor treatment for MRSA infections. Performed at [facility], [address], [place], [place] [address]          Radiology Studies: No results found.      Scheduled Meds: . albuterol  3 mL Inhalation QID  . Chlorhexidine Gluconate Cloth  6 each Topical Daily  . enoxaparin (LOVENOX) injection  80 mg Subcutaneous Q24H  .  guaiFENesin  600 mg Oral BID  . insulin aspart  0-20 Units Subcutaneous TID WC  . insulin aspart  0-5 Units Subcutaneous QHS  . insulin glargine  10 Units Subcutaneous QHS  . mouth rinse  15 mL Mouth Rinse BID  . nicotine  21 mg Transdermal Daily  . sodium chloride flush  3 mL Intravenous Once  . sodium chloride flush  3 mL Intravenous Q12H   Continuous Infusions: . sodium chloride Stopped (02/06/19 0127)  . azithromycin Stopped (02/06/19 0044)  . cefTRIAXone (ROCEPHIN)  IV Stopped (02/05/19 2321)     LOS: 2 days    Time spent:    Erick Blinks, MD [facility]   If 7PM-7AM, please contact night-coverage [email or web address]  02/06/2019, 6:13 PM

## 2019-02-06 NOTE — Progress Notes (Signed)
Patient refuses CPAP.  RT will continue to monitor. 

## 2019-02-07 DIAGNOSIS — J9601 Acute respiratory failure with hypoxia: Secondary | ICD-10-CM

## 2019-02-07 DIAGNOSIS — E119 Type 2 diabetes mellitus without complications: Secondary | ICD-10-CM

## 2019-02-07 LAB — BASIC METABOLIC PANEL
Anion gap: 11 (ref 5–15)
BUN: 19 mg/dL (ref 6–20)
CO2: 24 mmol/L (ref 22–32)
Calcium: 8.8 mg/dL — ABNORMAL LOW (ref 8.9–10.3)
Chloride: 105 mmol/L (ref 98–111)
Creatinine, Ser: 0.78 mg/dL (ref 0.61–1.24)
GFR calc Af Amer: >60 mL/min (ref >60)
GFR calc non Af Amer: >60 mL/min (ref >60)
Glucose, Bld: 139 mg/dL — ABNORMAL HIGH (ref 70–99)
Potassium: 3.7 mmol/L (ref 3.5–5.1)
Sodium: 140 mmol/L (ref 135–145)

## 2019-02-07 LAB — CBC
HCT: 44.1 % (ref 39.0–52.0)
Hemoglobin: 14.5 g/dL (ref 13.0–17.0)
MCH: 31 pg (ref 26.0–34.0)
MCHC: 32.9 g/dL (ref 30.0–36.0)
MCV: 94.2 fL (ref 80.0–100.0)
Platelets: 346 10*3/uL (ref 150–400)
RBC: 4.68 MIL/uL (ref 4.22–5.81)
RDW: 13.9 % (ref 11.5–15.5)
WBC: 11.6 10*3/uL — ABNORMAL HIGH (ref 4.0–10.5)
nRBC: 0 % (ref 0.0–0.2)

## 2019-02-07 LAB — GLUCOSE, CAPILLARY
Glucose-Capillary: 135 mg/dL — ABNORMAL HIGH (ref 70–99)
Glucose-Capillary: 150 mg/dL — ABNORMAL HIGH (ref 70–99)

## 2019-02-07 LAB — LEGIONELLA PNEUMOPHILA SEROGP 1 UR AG: L. pneumophila Serogp 1 Ur Ag: NEGATIVE

## 2019-02-07 MED ORDER — NICOTINE 21 MG/24HR TD PT24: 21.0000 mg | MEDICATED_PATCH | Freq: Every day | TRANSDERMAL | 0 refills | Status: DC

## 2019-02-07 MED ORDER — DOXYCYCLINE HYCLATE 100 MG PO TABS: 100.0000 mg | ORAL_TABLET | Freq: Two times a day (BID) | ORAL | 0 refills | Status: AC

## 2019-02-07 MED ORDER — METFORMIN HCL 500 MG PO TABS: 1000.0000 mg | ORAL_TABLET | Freq: Two times a day (BID) | ORAL | 11 refills | Status: DC

## 2019-02-07 MED ORDER — ACETAMINOPHEN 325 MG PO TABS: 650.0000 mg | ORAL_TABLET | Freq: Four times a day (QID) | ORAL | 0 refills | Status: AC | PRN

## 2019-02-07 MED ORDER — GUAIFENESIN ER 600 MG PO TB12: 600.0000 mg | ORAL_TABLET | Freq: Two times a day (BID) | ORAL | 0 refills | Status: AC

## 2019-02-07 MED ORDER — BLOOD GLUCOSE METER KIT: PACK | 3 refills | Status: DC

## 2019-02-07 NOTE — Progress Notes (Signed)
Patient is to be discharged home and in stable condition. Patient's IV and telemetry removed, WNL. Patient given discharge instructions and verbalized understanding of instructions, medications and the need for follow-up appointments. Patient denies the need for a wheelchair escort out, ambulated to awaiting vehicle with staff present.  Celestia Khat, RN

## 2019-02-07 NOTE — Discharge Summary (Signed)
Edward Booth, is a 38 y.o. male  DOB 04-04-1981  MRN [id number].  Admission date:  02/03/2019  Admitting Physician  Roxan Hockey, MD  Discharge Date:  02/07/2019   Primary MD  System, Pcp Not In  Recommendations for primary care physician for things to follow:    1) you have diabetes--- reduce caloric intake, increase exercise advised 2) take metformin 1000 mg twice a day for diabetes as prescribed 3) smoking cessation strongly advised--- please use nicotine patch to help you quit smoking 4) take doxycycline 100 mg twice a day for another week to complete your treatment for pneumonia 5) follow-up to primary care physician for ongoing management of diabetes and obesity--- again increase exercise, reduce caloric intake strongly encouraged  Admission Diagnosis  Shortness of breath [R06.02] Tachycardia [R00.0] Flank pain [R10.9] Chest pain, unspecified type [R07.9] Community acquired pneumonia, unspecified laterality [J18.9] PNA (pneumonia) [J18.9]   Discharge Diagnosis  Shortness of breath [R06.02] Tachycardia [R00.0] Flank pain [R10.9] Chest pain, unspecified type [R07.9] Community acquired pneumonia, unspecified laterality [J18.9] PNA (pneumonia) [J18.9]    Active Problems:   Dyspnea   CAP (community acquired pneumonia)   Chest pain   PNA (pneumonia)   Acute respiratory failure with hypoxia (HCC)   Obesity, Class III, BMI 40-49.9 (morbid obesity) ([place])   Hyperglycemia   Leukocytosis   Type 2 diabetes mellitus without complication ([place])      Past Medical History:  Diagnosis Date   Dysuria    Family history of adverse reaction to anesthesia    mother-- ponv   Headache    Hematuria    Left ureteral stone    Nephrolithiasis    bilateral non-obstructive per CT 09-10-2017   Urgency of urination     Past Surgical History:  Procedure Laterality Date   CYSTOSCOPY W/  URETERAL STENT PLACEMENT Left 09/21/2017   Procedure: CYSTOSCOPY WITH RETROGRADE PYELOGRAM/URETERAL STENT PLACEMENT;  Surgeon: Lucas Mallow, MD;  Location: [facility];  Service: Urology;  Laterality: Left;   CYSTOSCOPY WITH RETROGRADE PYELOGRAM, URETEROSCOPY AND STENT PLACEMENT Left 10/07/2017   Procedure: CYSTOSCOPY WITH LEFT  RETROGRADE PYELOGRAM, URETEROSCOPY ,LITHROTRIPSY.  AND STENT REPLACEMENT;  Surgeon: Lucas Mallow, MD;  Location: [facility];  Service: Urology;  Laterality: Left;   HOLMIUM LASER APPLICATION Left 01/05/2418   Procedure: HOLMIUM LASER APPLICATION;  Surgeon: Lucas Mallow, MD;  Location: [facility];  Service: Urology;  Laterality: Left;   INGUINAL HERNIA REPAIR Left 1986       HPI  from the history and physical done on the day of admission:    -  Edward Booth  is a 38 y.o. male, w nephrolithiasis, apparently c/o chest pain / pleurititic, and dyspnea starting earlier yesterday.  Pt denies fever, chills, cough, palp, n/v, diarrhea, alteration in taste/ smell. , brbpr, black stool, dysuria, hematuria.   In ED,  T 99 P 128 R 20, Bp 146/106  Pox 94% on RA WT 157.4kg  CXR IMPRESSION: 1. Increasing streaky  bibasilar opacities, developing pneumonia versus atelectasis. 2. Small left pleural effusion on CT not well seen radiographically.  CTA chest IMPRESSION: 1. No central pulmonary embolus, to the distal lobar/proximal segmental level. 2. Heterogeneous bilateral lower lobe linear and ground-glass opacities, left greater than right. Findings may be infectious or inflammatory. Small left pleural effusion. 3. Coronary artery calcifications.  Lactic acid 1.3 Ast 19, Alt 30 Na 136, K 4.3, Bun 12, Creatinine 0.94 Wbc 14.2, Hgb 16.9, Plt 341 Trop 3  Blood culture x2 pending Urinalysis pending  Pt will be admitted for dyspnea, CAP, r/o covid -19,    Hospital Course:         Brief Narrative:  38 year old  male admitted to the hospital with chest pain, shortness of breath and found to have community-acquired pneumonia with acute respiratory failure.  He was admitted for intravenous antibiotics and supplemental oxygen.   Assessment & Plan:   Active Problems:   Dyspnea   CAP (community acquired pneumonia)   Chest pain   PNA (pneumonia)   Acute respiratory failure with hypoxia (HCC)   Obesity, Class III, BMI 40-49.9 (morbid obesity) (HCC)   Hyperglycemia   Leukocytosis   1. Acute respiratory failure with hypoxia.  Secondary to community-acquired pneumonia.    Weaned off oxygen.  CTA chest is negative for pulmonary embolus.  COVID-19 negative -Treated with azithromycin/Rocephin, discharged on p.o. doxycycline   2. Community-acquired pneumonia.   .  Pneumococcal antigen is negative.  Legionella antigen negative -treat as #1 above  3)DM2- Aic 7.3--, newly diagnosed, patient's wife is a diabetic, patient is familiar with diabetic diet and wife will help him with fingersticks-.  Lifestyle and diet modifications discussed, patient will start metformin and follow-up with PCP for further management of DM2 -Patient was on steroids this admission, glycemic control should improve off steroids   4)History of asthma.  Initially started on steroids, but since there is no wheezing, will discontinue further steroids.  5)Morbid obesity-this complicates overall care, BMI is 45, lifestyle and diet modifications discussed extensively  6) tobacco abuse--cessation strongly encouraged, patient will use nicotine patch to help him quit.   Discharge Condition: stable  Follow UP--- PCP as advised  Diet and Activity recommendation:  As advised  Discharge Instructions    Discharge Instructions    Call MD for:  difficulty breathing, headache or visual disturbances   Complete by: As directed    Call MD for:  extreme fatigue   Complete by: As directed    Call MD for:  persistant dizziness or  light-headedness   Complete by: As directed    Call MD for:  persistant nausea and vomiting   Complete by: As directed    Call MD for:  severe uncontrolled pain   Complete by: As directed    Call MD for:  temperature >100.4   Complete by: As directed    Diet Carb Modified   Complete by: As directed    Discharge instructions   Complete by: As directed    1) you have diabetes--- reduce caloric intake, increase exercise advised 2) take metformin 1000 mg twice a day for diabetes as prescribed 3) smoking cessation strongly advised--- please use nicotine patch to help you quit smoking 4) take doxycycline 100 mg twice a day for another week to complete your treatment for pneumonia 5) follow-up to primary care physician for ongoing management of diabetes and obesity--- again increase exercise, reduce caloric intake strongly encouraged   Increase activity slowly   Complete by: As  directed         Discharge Medications     Allergies as of 02/07/2019      Reactions   Aspartame And Phenylalanine Anaphylaxis      Medication List    STOP taking these medications   cephALEXin 500 MG capsule Commonly known as: KEFLEX   HYDROcodone-acetaminophen 5-325 MG tablet Commonly known as: NORCO/VICODIN     TAKE these medications   acetaminophen 325 MG tablet Commonly known as: TYLENOL Take 2 tablets (650 mg total) by mouth every 6 (six) hours as needed for mild pain (or Fever >/= 101).   blood glucose meter kit and supplies [employer] Prime or Dispense other brand based on patient and insurance preference. Use up to four times daily as directed. (FOR ICD-9 250.00, 250.01).   doxycycline 100 MG tablet Commonly known as: VIBRA-TABS Take 1 tablet (100 mg total) by mouth 2 (two) times daily for 7 days.   guaiFENesin 600 MG 12 hr tablet Commonly known as: MUCINEX Take 1 tablet (600 mg total) by mouth 2 (two) times daily for 10 days.   metFORMIN 500 MG tablet Commonly known as: Glucophage Take  2 tablets (1,000 mg total) by mouth 2 (two) times daily with a meal.   nicotine 21 mg/24hr patch Commonly known as: NICODERM CQ - dosed in mg/24 hours Place 1 patch (21 mg total) onto the skin daily. Start taking on: February 08, 2019   oxyCODONE-acetaminophen 5-325 MG tablet Commonly known as: PERCOCET/ROXICET Take 2 tablets by mouth every 6 (six) hours as needed. for pain       Major procedures and Radiology Reports - PLEASE review detailed and final reports for all details, in brief -   Dg Chest 2 View  Result Date: 02/03/2019 CLINICAL DATA:  Chest pain EXAM: CHEST - 2 VIEW COMPARISON:  None. FINDINGS: Bibasilar areas of atelectasis. Bandlike opacity in the periphery of the left lung base could reflect subsegmental atelectasis or scarring. No consolidation, features of edema, pneumothorax, or effusion. The cardiomediastinal contours are unremarkable. No acute osseous or soft tissue abnormality. IMPRESSION: 1. Atelectasis, otherwise no acute cardiopulmonary abnormality. 2. Bandlike opacity in the periphery of the left lung base could reflect subsegmental atelectasis or scarring. Electronically Signed   By: Lovena Le M.D.   On: 02/03/2019 20:49   Ct Angio Chest Pe W Or Wo Contrast  Result Date: 02/03/2019 CLINICAL DATA:  PE suspected, high pretest prob. Patient reports left flank and chest pain. EXAM: CT ANGIOGRAPHY CHEST WITH CONTRAST TECHNIQUE: Multidetector CT imaging of the chest was performed using the standard protocol during bolus administration of intravenous contrast. Multiplanar CT image reconstructions and MIPs were obtained to evaluate the vascular anatomy. CONTRAST:  111m OMNIPAQUE IOHEXOL 350 MG/ML SOLN COMPARISON:  Chest radiograph earlier this day. FINDINGS: Cardiovascular: Suboptimal evaluation for pulmonary embolus. Examination is diagnostic to the distal lobar/proximal segmental level for evaluation of pulmonary embolus. No central filling defect in the pulmonary arteries.  The segmental and subsegmental branches are not well assessed. Heart is normal in size. There are coronary artery calcifications. No pericardial effusion. Thoracic aorta is normal in caliber without dissection. Mediastinum/Nodes: Few prominent mediastinal and hilar lymph nodes are not enlarged by size criteria. Decompressed esophagus. No visualized thyroid nodule. Lungs/Pleura: Breathing motion artifact limits detailed assessment. Heterogeneous opacities in both lower lobes with linear and ground-glass opacities, left greater than right. Small left pleural effusion. No septal thickening or pulmonary edema. Trachea and mainstem bronchi are patent. No pulmonary mass or suspicious  nodule. Upper Abdomen: Assessed on concurrent abdominopelvic CT. No acute findings in the visualized portion. Musculoskeletal: There are no acute or suspicious osseous abnormalities. Review of the MIP images confirms the above findings. IMPRESSION: 1. No central pulmonary embolus, to the distal lobar/proximal segmental level. 2. Heterogeneous bilateral lower lobe linear and ground-glass opacities, left greater than right. Findings may be infectious or inflammatory. Small left pleural effusion. 3. Coronary artery calcifications. Electronically Signed   By: Keith Rake M.D.   On: 02/03/2019 23:37   Dg Chest Port 1 View  Result Date: 02/04/2019 CLINICAL DATA:  Increasing shortness of breath. EXAM: PORTABLE CHEST 1 VIEW COMPARISON:  Radiographs and CT yesterday. FINDINGS: Low lung volumes. Increasing streaky bibasilar opacities. Increasing peribronchial thickening. Unchanged heart size and mediastinal contours. Small left pleural effusion on CT not well seen radiographically. No pneumothorax. IMPRESSION: 1. Increasing streaky bibasilar opacities, developing pneumonia versus atelectasis. 2. Small left pleural effusion on CT not well seen radiographically. Electronically Signed   By: Keith Rake M.D.   On: 02/04/2019 03:11   Ct  Renal Stone Study  Result Date: 02/03/2019 CLINICAL DATA:  38 year old male with flank pain. Concern for kidney stone. EXAM: CT ABDOMEN AND PELVIS WITHOUT CONTRAST TECHNIQUE: Multidetector CT imaging of the abdomen and pelvis was performed following the standard protocol without IV contrast. COMPARISON:  CT of the abdomen pelvis dated 07/22/2018 FINDINGS: Evaluation of this exam is limited in the absence of intravenous contrast. Lower chest: Trace left pleural effusion. Please see report for the CTA of the chest. No intra-abdominal free air or free fluid. Hepatobiliary: Diffuse fatty infiltration of the liver. No intrahepatic biliary ductal dilatation. The gallbladder is unremarkable. Pancreas: Unremarkable. No pancreatic ductal dilatation or surrounding inflammatory changes. Spleen: Normal in size without focal abnormality. Adrenals/Urinary Tract: Adrenal glands are unremarkable. Kidneys are normal, without renal calculi, focal lesion, or hydronephrosis. Bladder is unremarkable. Stomach/Bowel: There is sigmoid diverticulosis without active inflammatory changes. There is no bowel obstruction or active inflammation. The appendix is normal. Vascular/Lymphatic: The abdominal aorta and IVC are grossly unremarkable. No portal venous gas. There is no adenopathy. Reproductive: The prostate and seminal vesicles are grossly unremarkable. Other: None Musculoskeletal: No acute or significant osseous findings. IMPRESSION: 1. No acute intra-abdominal or pelvic pathology. No hydronephrosis or nephrolithiasis. 2. Fatty liver. 3. Sigmoid diverticulosis. No bowel obstruction or active inflammation. Normal appendix. Electronically Signed   By: Anner Crete M.D.   On: 02/03/2019 23:28    Micro Results    Recent Results (from the past 240 hour(s))  SARS Coronavirus 2 [facility] order, Performed in [facility] hospital lab) Nasopharyngeal Nasopharyngeal Swab     Status: None   Collection Time: 02/04/19 12:04 AM   Specimen:  Nasopharyngeal Swab  Result Value Ref Range Status   SARS Coronavirus 2 NEGATIVE NEGATIVE Final    Comment: (NOTE) If result is NEGATIVE SARS-CoV-2 target nucleic acids are NOT DETECTED. The SARS-CoV-2 RNA is generally detectable in upper and lower  respiratory specimens during the acute phase of infection. The lowest  concentration of SARS-CoV-2 viral copies this assay can detect is 250  copies / mL. A negative result does not preclude SARS-CoV-2 infection  and should not be used as the sole basis for treatment or other  patient management decisions.  A negative result may occur with  improper specimen collection / handling, submission of specimen other  than nasopharyngeal swab, presence of viral mutation(s) within the  areas targeted by this assay, and inadequate number of viral copies  (<  250 copies / mL). A negative result must be combined with clinical  observations, patient history, and epidemiological information. If result is POSITIVE SARS-CoV-2 target nucleic acids are DETECTED. The SARS-CoV-2 RNA is generally detectable in upper and lower  respiratory specimens dur ing the acute phase of infection.  Positive  results are indicative of active infection with SARS-CoV-2.  Clinical  correlation with patient history and other diagnostic information is  necessary to determine patient infection status.  Positive results do  not rule out bacterial infection or co-infection with other viruses. If result is PRESUMPTIVE POSTIVE SARS-CoV-2 nucleic acids MAY BE PRESENT.   A presumptive positive result was obtained on the submitted specimen  and confirmed on repeat testing.  While 2019 novel coronavirus  (SARS-CoV-2) nucleic acids may be present in the submitted sample  additional confirmatory testing may be necessary for epidemiological  and / or clinical management purposes  to differentiate between  SARS-CoV-2 and other Sarbecovirus currently known to infect humans.  If clinically  indicated additional testing with an alternate test  methodology [phone]) is advised. The SARS-CoV-2 RNA is generally  detectable in upper and lower respiratory sp ecimens during the acute  phase of infection. The expected result is Negative. Fact Sheet for Patients:  StrictlyIdeas.no Fact Sheet for Healthcare Providers: BankingDealers.co.za This test is not yet approved or cleared by the [place] FDA and has been authorized for detection and/or diagnosis of SARS-CoV-2 by FDA under an Emergency Use Authorization (EUA).  This EUA will remain in effect (meaning this test can be used) for the duration of the COVID-19 declaration under Section 564(b)(1) of the Act, 21 U.S.C. section 360bbb-3(b)(1), unless the authorization is terminated or revoked sooner. Performed at [facility], [address], [place], [place] [address]   Blood Culture (routine x 2)     Status: None (Preliminary result)   Collection Time: 02/04/19  1:30 AM   Specimen: BLOOD  Result Value Ref Range Status   Specimen Description BLOOD BLOOD RIGHT WRIST  Final   Special Requests   Final    BOTTLES DRAWN AEROBIC AND ANAEROBIC Blood Culture adequate volume   Culture   Final    NO GROWTH 3 DAYS Performed at [facility], [address] [place], [place] [address]    Report Status PENDING  Incomplete  Blood Culture (routine x 2)     Status: None (Preliminary result)   Collection Time: 02/04/19  1:32 AM   Specimen: BLOOD  Result Value Ref Range Status   Specimen Description BLOOD LEFT ANTECUBITAL  Final   Special Requests   Final    BOTTLES DRAWN AEROBIC AND ANAEROBIC Blood Culture adequate volume   Culture   Final    NO GROWTH 3 DAYS Performed at [facility], [address], [place], [place] [address]    Report Status PENDING  Incomplete  SARS Coronavirus 2 [facility] order, Performed in [place] hospital lab) Nasopharyngeal Nasopharyngeal Swab     Status: None    Collection Time: 02/04/19  3:25 AM   Specimen: Nasopharyngeal Swab  Result Value Ref Range Status   SARS Coronavirus 2 NEGATIVE NEGATIVE Final    Comment: (NOTE) If result is NEGATIVE SARS-CoV-2 target nucleic acids are NOT DETECTED. The SARS-CoV-2 RNA is generally detectable in upper and lower  respiratory specimens during the acute phase of infection. The lowest  concentration of SARS-CoV-2 viral copies this assay can detect is 250  copies / mL. A negative result does not preclude SARS-CoV-2 infection  and should not be used as the sole basis for treatment or other  patient management decisions.  A negative result may occur with  improper specimen collection / handling, submission of specimen other  than nasopharyngeal swab, presence of viral mutation(s) within the  areas targeted by this assay, and inadequate number of viral copies  (<250 copies / mL). A negative result must be combined with clinical  observations, patient history, and epidemiological information. If result is POSITIVE SARS-CoV-2 target nucleic acids are DETECTED. The SARS-CoV-2 RNA is generally detectable in upper and lower  respiratory specimens dur ing the acute phase of infection.  Positive  results are indicative of active infection with SARS-CoV-2.  Clinical  correlation with patient history and other diagnostic information is  necessary to determine patient infection status.  Positive results do  not rule out bacterial infection or co-infection with other viruses. If result is PRESUMPTIVE POSTIVE SARS-CoV-2 nucleic acids MAY BE PRESENT.   A presumptive positive result was obtained on the submitted specimen  and confirmed on repeat testing.  While 2019 novel coronavirus  (SARS-CoV-2) nucleic acids may be present in the submitted sample  additional confirmatory testing may be necessary for epidemiological  and / or clinical management purposes  to differentiate between  SARS-CoV-2 and other Sarbecovirus  currently known to infect humans.  If clinically indicated additional testing with an alternate test  methodology [phone]) is advised. The SARS-CoV-2 RNA is generally  detectable in upper and lower respiratory sp ecimens during the acute  phase of infection. The expected result is Negative. Fact Sheet for Patients:  StrictlyIdeas.no Fact Sheet for Healthcare Providers: BankingDealers.co.za This test is not yet approved or cleared by the [place] FDA and has been authorized for detection and/or diagnosis of SARS-CoV-2 by FDA under an Emergency Use Authorization (EUA).  This EUA will remain in effect (meaning this test can be used) for the duration of the COVID-19 declaration under Section 564(b)(1) of the Act, 21 U.S.C. section 360bbb-3(b)(1), unless the authorization is terminated or revoked sooner. Performed at [facility], [address] [place], [place] [address]   MRSA PCR Screening     Status: None   Collection Time: 02/04/19 11:30 PM   Specimen: Nasal Mucosa; Nasopharyngeal  Result Value Ref Range Status   MRSA by PCR NEGATIVE NEGATIVE Final    Comment:        The GeneXpert MRSA Assay (FDA approved for NASAL specimens only), is one component of a comprehensive MRSA colonization surveillance program. It is not intended to diagnose MRSA infection nor to guide or monitor treatment for MRSA infections. Performed at [facility], [address], [place], [place] [address]        Today   Subjective    Edward Booth today has no new complaints -Wife at bedside, questions answered          Patient has been seen and examined prior to discharge   Objective   Blood pressure (!) 135/103, pulse 94, temperature 97.8 F (36.6 C), temperature source Oral, resp. rate (!) 23, height 6' (1.829 m), weight (!) 150.4 kg, SpO2 95 %.   Intake/Output Summary (Last 24 hours) at 02/07/2019 1638 Last data filed at 02/06/2019  2221 Gross per 24 hour  Intake 3 ml  Output --  Net 3 ml    Exam Gen:- Awake Alert, no acute distress , morbidly obese HEENT:- .AT, No sclera icterus Neck-Supple Neck,No JVD,.  Lungs-fair symmetrical air movement, no wheezing CV- S1, S2 normal, regular  Abd-  +ve B.Sounds, Abd Soft, No tenderness,   increased truncal adiposity Extremity/Skin:- No  edema,   good pulses Psych-affect is appropriate, oriented x3 Neuro-no new focal deficits, no tremors    Data Review   CBC w Diff:  Lab Results  Component Value Date   WBC 11.6 (H) 02/07/2019   HGB 14.5 02/07/2019   HCT 44.1 02/07/2019   PLT 346 02/07/2019   LYMPHOPCT 27 09/20/2017   MONOPCT 12 09/20/2017   EOSPCT 3 09/20/2017   BASOPCT 0 09/20/2017    CMP:  Lab Results  Component Value Date   NA 140 02/07/2019   K 3.7 02/07/2019   CL 105 02/07/2019   CO2 24 02/07/2019   BUN 19 02/07/2019   CREATININE 0.78 02/07/2019   PROT 7.9 02/04/2019   PROT 7.1 02/04/2019   ALBUMIN 4.2 02/04/2019   ALBUMIN 3.8 02/04/2019   BILITOT 0.6 02/04/2019   BILITOT 0.6 02/04/2019   ALKPHOS 101 02/04/2019   ALKPHOS 89 02/04/2019   AST 19 02/04/2019   AST 17 02/04/2019   ALT 30 02/04/2019   ALT 26 02/04/2019  .   Total Discharge time is about 33 minutes  Roxan Hockey M.D on 02/07/2019 at 4:38 PM  Go to [email or web address] -  for contact info  [facility] - Office  [phone]

## 2019-02-07 NOTE — Progress Notes (Signed)
Patient ambulated around unit without complications, oxygen saturations maintained above 95%. MD made aware.  Celestia Khat, RN

## 2019-02-07 NOTE — Discharge Instructions (Signed)
1) you have diabetes--- reduce caloric intake, increase exercise advised 2) take metformin 1000 mg twice a day for diabetes as prescribed 3) smoking cessation strongly advised--- please use nicotine patch to help you quit smoking 4) take doxycycline 100 mg twice a day for another week to complete your treatment for pneumonia 5) follow-up to primary care physician for ongoing management of diabetes and obesity--- again increase exercise, reduce caloric intake strongly encouraged

## 2019-02-09 LAB — CULTURE, BLOOD (ROUTINE X 2)
Culture: NO GROWTH
Culture: NO GROWTH
Special Requests: ADEQUATE
Special Requests: ADEQUATE

## 2019-08-08 ENCOUNTER — Other Ambulatory Visit (HOSPITAL_COMMUNITY): Payer: Self-pay | Admitting: Physician Assistant

## 2019-11-02 IMAGING — CR DG ABDOMEN 1V
1 series · 3 of 3 positions shown · non-contrast
Comparison: CT 09/10/2013

CLINICAL DATA: Left flank pain.  Known left ureteral stone.

EXAM:
ABDOMEN - 1 VIEW

[Series 1: supine ap · 0.17mm/px · 3 of 3 slices shown]
[im 1/3]
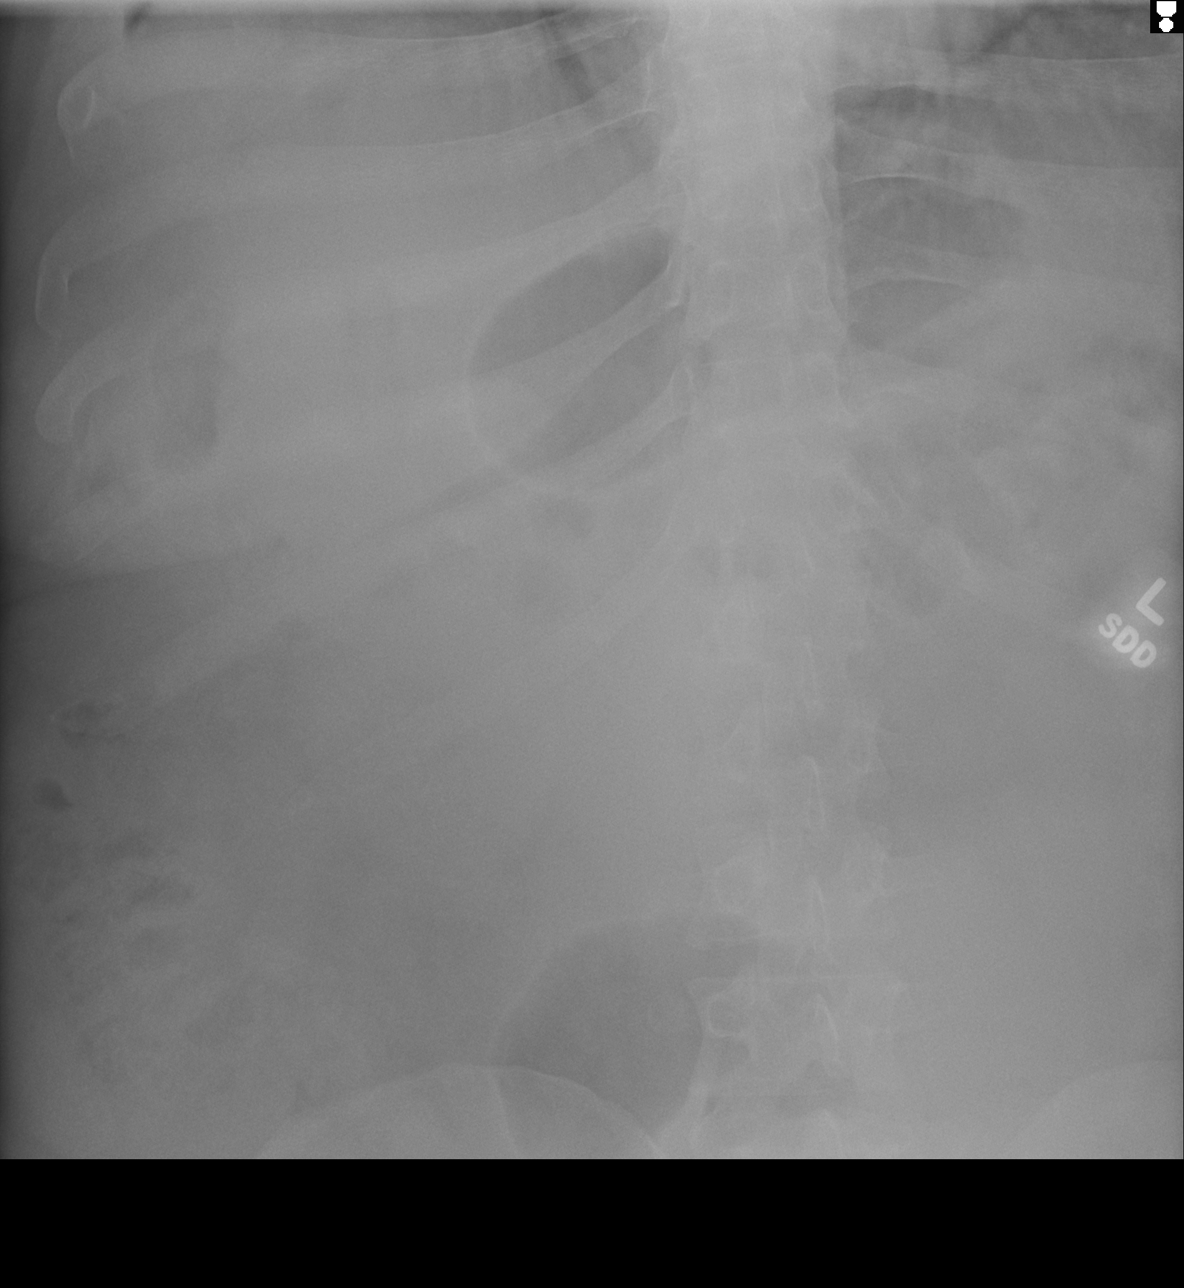
[im 2/3]
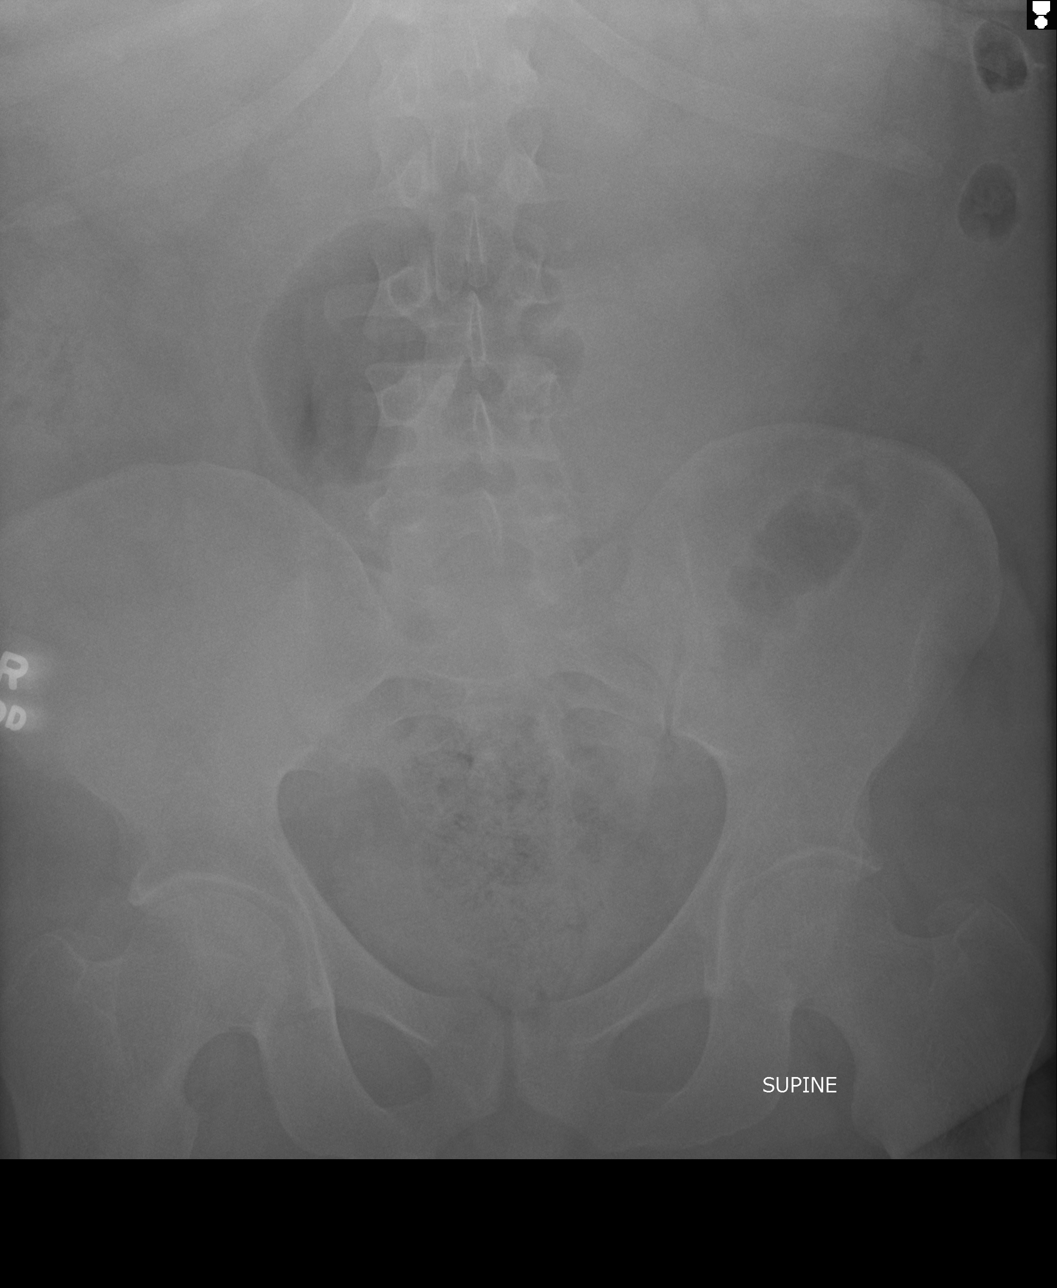
[im 3/3]
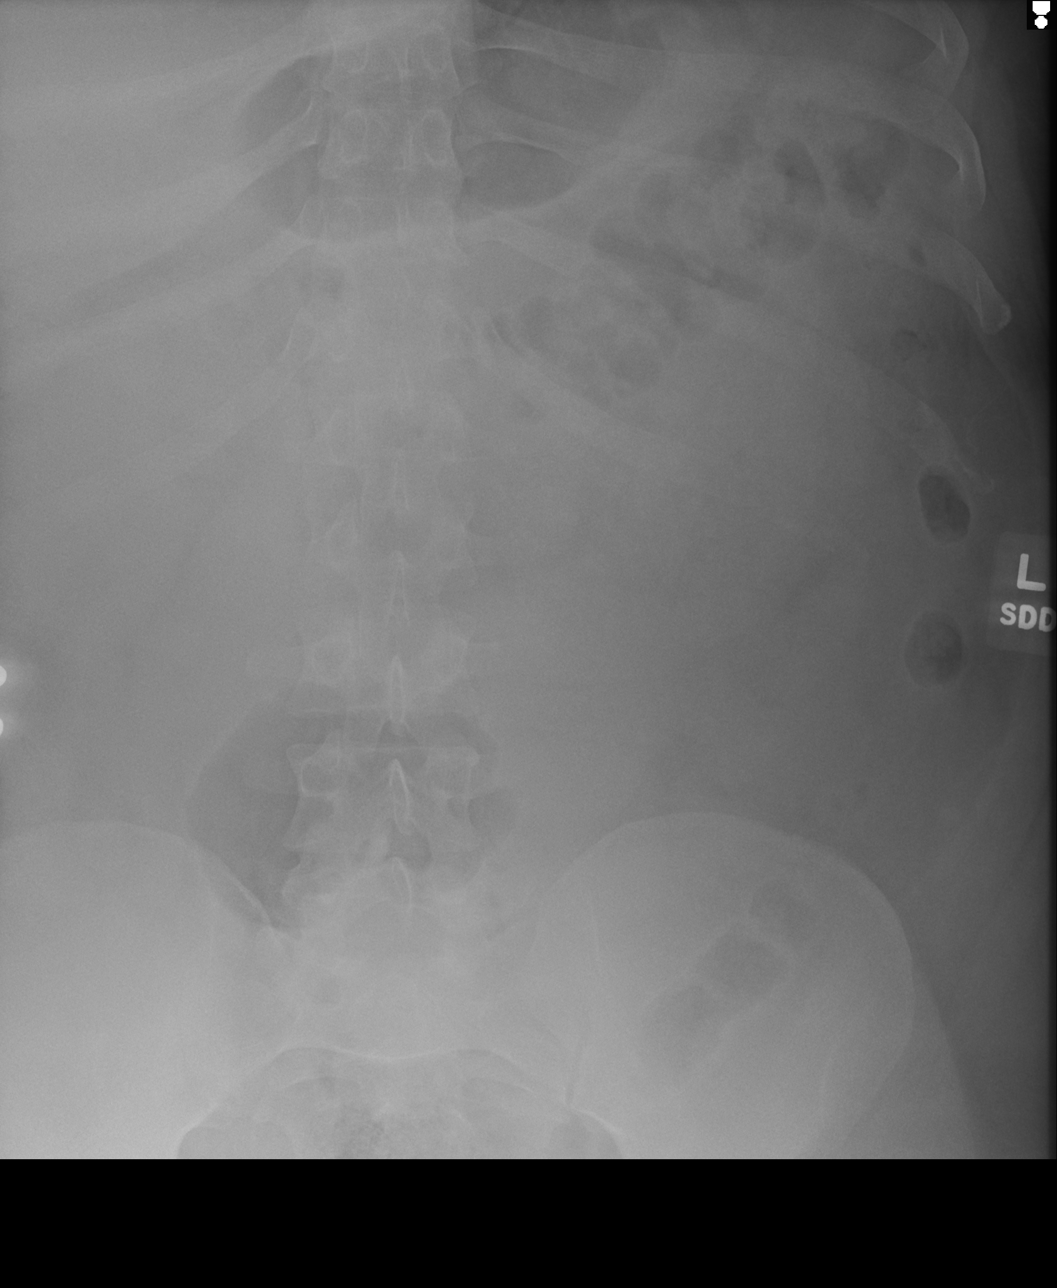

[3 of 3 positions shown; findings below may reference images not displayed]

FINDINGS: Possible small stone projects between the L2 and L3 left transverse
processes and may reflect previously seen left ureteral stone.
Nonobstructive bowel gas pattern. No free air or organomegaly.
IMPRESSION: Possible small left mid ureteral stone between the L2 and L3 left
transverse processes.

## 2021-03-17 IMAGING — CT CT RENAL STONE PROTOCOL
2 of 4 series · 16 of 46 positions shown, 18 images · non-contrast
Comparison: CT of the abdomen pelvis dated 07/22/2018

CLINICAL DATA: 38-year-old male with flank pain. Concern for kidney
stone.

EXAM:
CT ABDOMEN AND PELVIS WITHOUT CONTRAST
TECHNIQUE: Multidetector CT imaging of the abdomen and pelvis was performed
following the standard protocol without IV contrast.

[Series 2: axial st · axial · 0.98mm/px · z∈[-664,-209]mm · 13 of 105 slices shown, 15 images]
[im 7/105  soft-tissue]
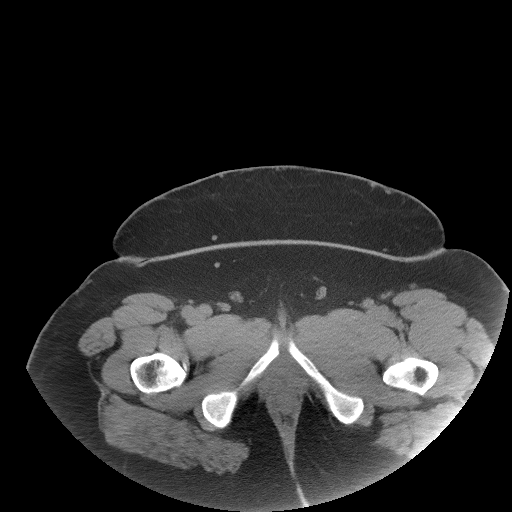
[im 7/105  bone]
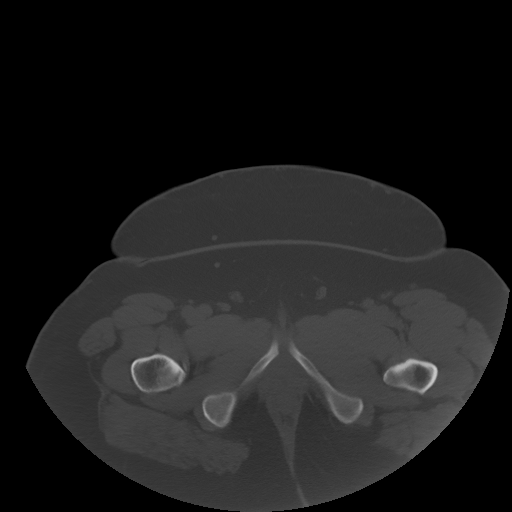
[im 14/105  soft-tissue]
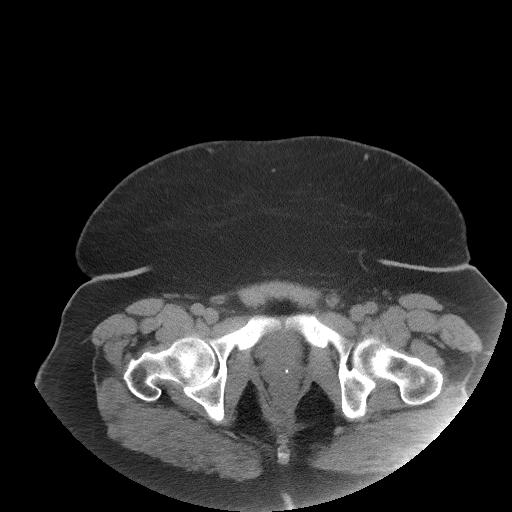
[im 20/105  soft-tissue]
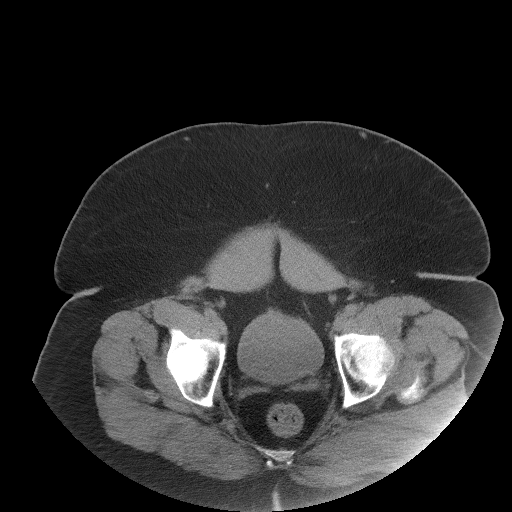
[im 33/105  soft-tissue]
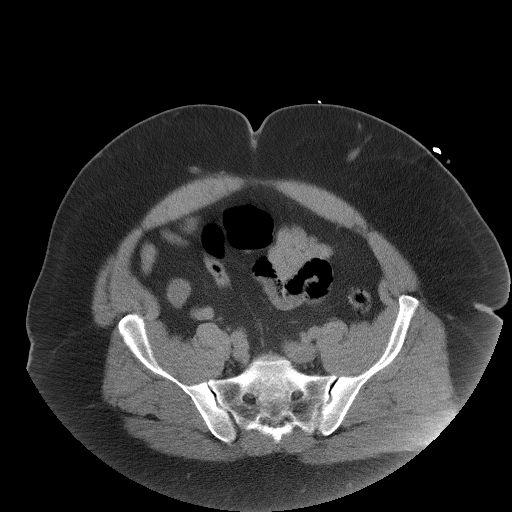
[im 40/105  soft-tissue]
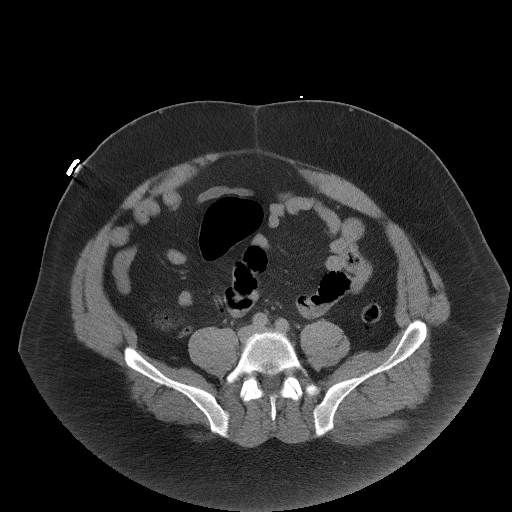
[im 46/105  soft-tissue]
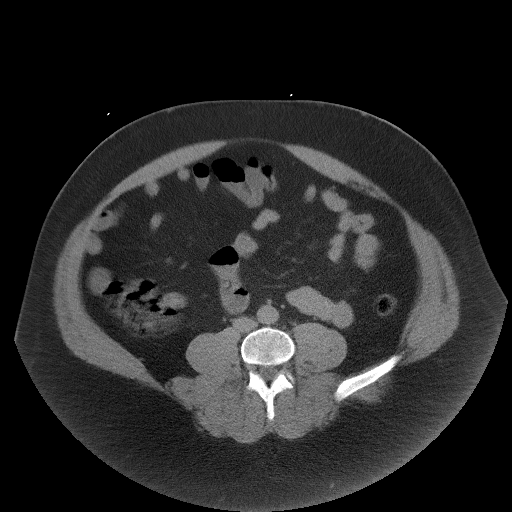
[im 53/105  soft-tissue]
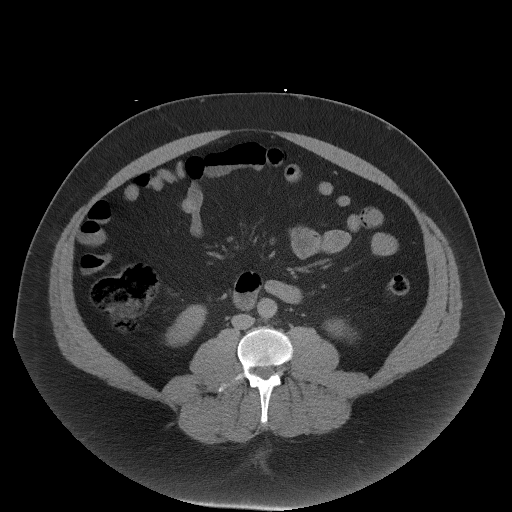
[im 59/105  soft-tissue]
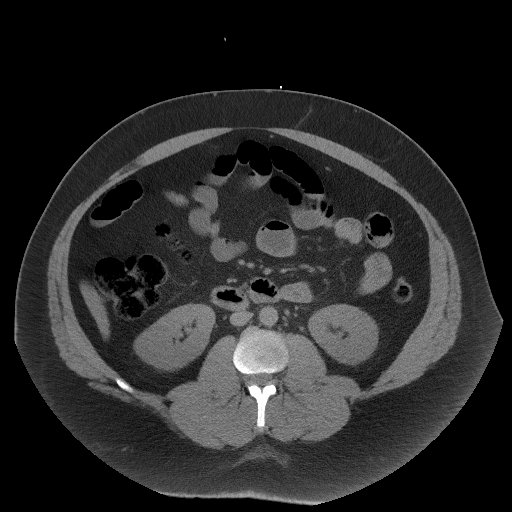
[im 66/105  soft-tissue]
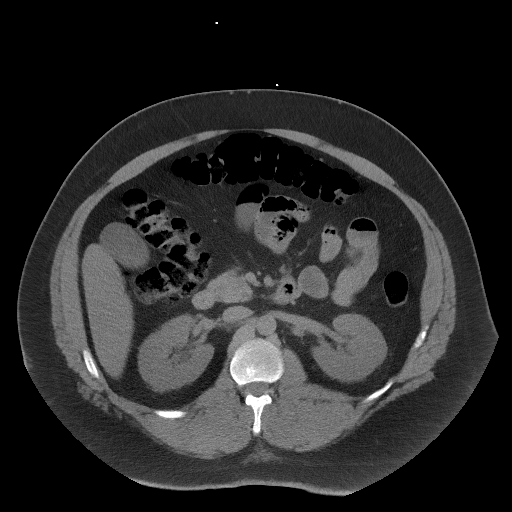
[im 66/105  bone]
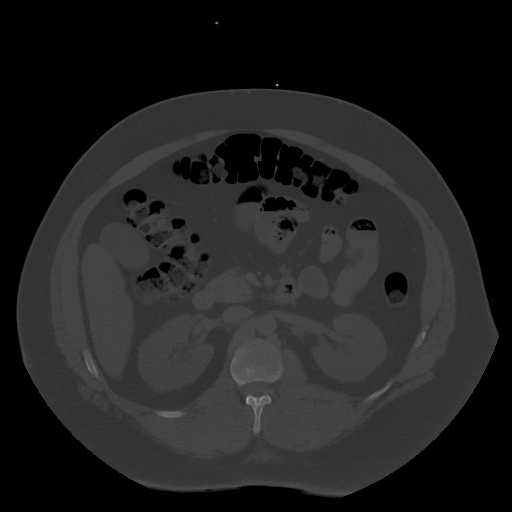
[im 72/105  soft-tissue]
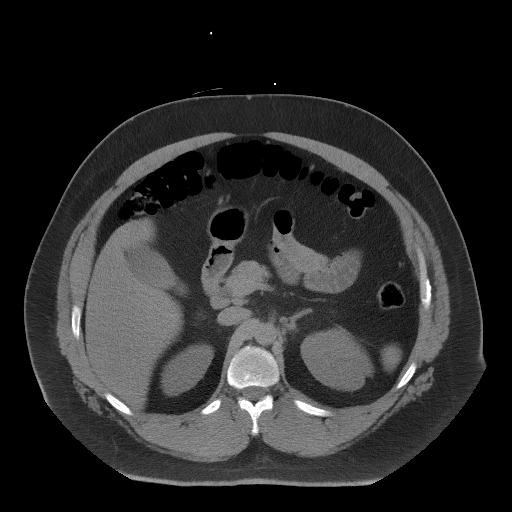
[im 85/105  soft-tissue]
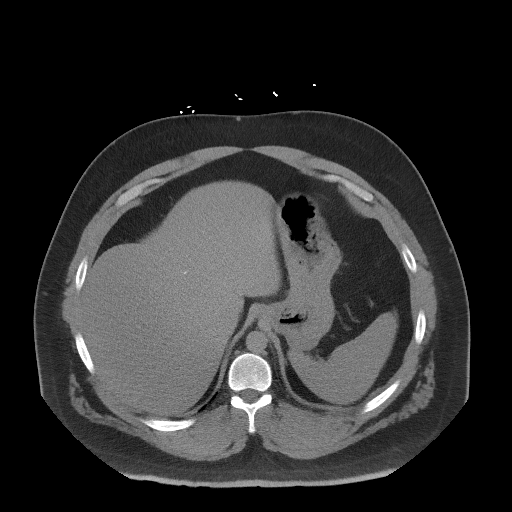
[im 92/105  soft-tissue]
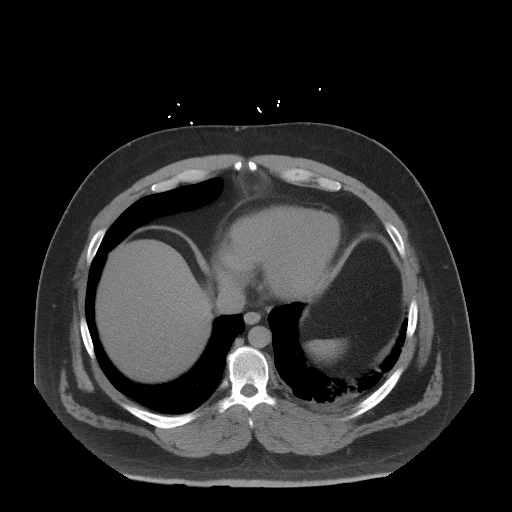
[im 98/105  soft-tissue]
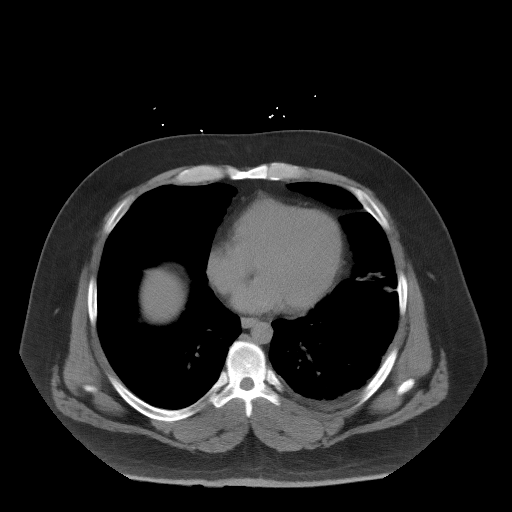

[Series 5: coronal st · coronal · 0.88mm/px · 3 of 132 slices shown]
[im 44/132  soft-tissue]
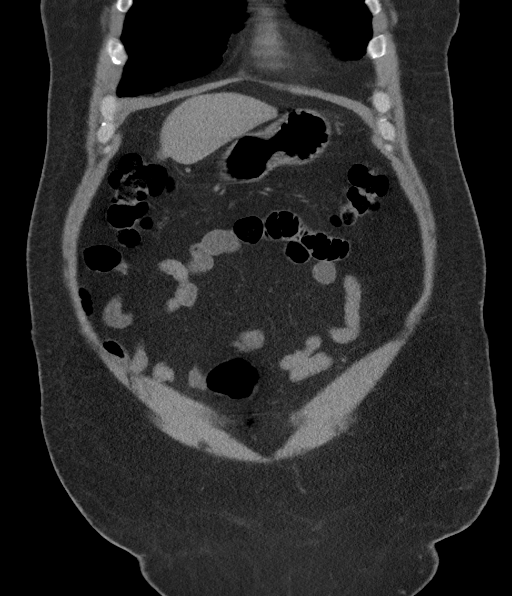
[im 59/132  soft-tissue]
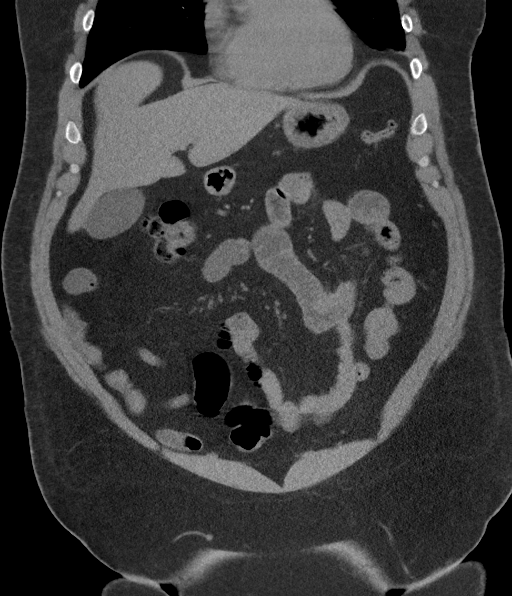
[im 73/132  soft-tissue]
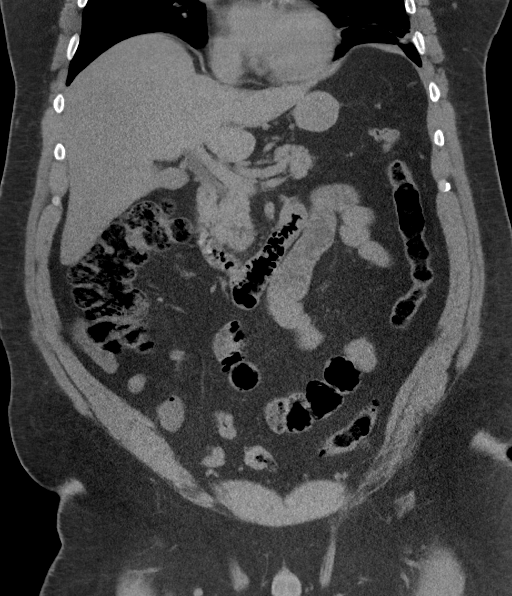

[16 of 46 positions shown; findings below may reference images not displayed]

FINDINGS: Evaluation of this exam is limited in the absence of intravenous
contrast.

Lower chest: Trace left pleural effusion. Please see report for the
CTA of the chest.

No intra-abdominal free air or free fluid.

Hepatobiliary: Diffuse fatty infiltration of the liver. No
intrahepatic biliary ductal dilatation. The gallbladder is
unremarkable.

Pancreas: Unremarkable. No pancreatic ductal dilatation or
surrounding inflammatory changes.

Spleen: Normal in size without focal abnormality.

Adrenals/Urinary Tract: Adrenal glands are unremarkable. Kidneys are
normal, without renal calculi, focal lesion, or hydronephrosis.
Bladder is unremarkable.

Stomach/Bowel: There is sigmoid diverticulosis without active
inflammatory changes. There is no bowel obstruction or active
inflammation. The appendix is normal.

Vascular/Lymphatic: The abdominal aorta and IVC are grossly
unremarkable. No portal venous gas. There is no adenopathy.

Reproductive: The prostate and seminal vesicles are grossly
unremarkable.

Other: None

Musculoskeletal: No acute or significant osseous findings.
IMPRESSION: 1. No acute intra-abdominal or pelvic pathology. No hydronephrosis
or nephrolithiasis.
2. Fatty liver.
3. Sigmoid diverticulosis. No bowel obstruction or active
inflammation. Normal appendix.

## 2023-05-25 DIAGNOSIS — M7581 Other shoulder lesions, right shoulder: Secondary | ICD-10-CM | POA: Diagnosis not present

## 2023-11-16 ENCOUNTER — Telehealth: Admitting: Physician Assistant

## 2023-11-16 DIAGNOSIS — L732 Hidradenitis suppurativa: Secondary | ICD-10-CM | POA: Diagnosis not present

## 2023-11-16 MED ORDER — IBUPROFEN 800 MG PO TABS: 800.0000 mg | ORAL_TABLET | Freq: Three times a day (TID) | ORAL | 0 refills | Status: AC | PRN

## 2023-11-16 MED ORDER — SULFAMETHOXAZOLE-TRIMETHOPRIM 800-160 MG PO TABS: 1.0000 | ORAL_TABLET | Freq: Two times a day (BID) | ORAL | 0 refills | Status: DC

## 2023-11-16 NOTE — Patient Instructions (Signed)
 Adine JINNY Grieves, thank you for joining Delon CHRISTELLA Dickinson, PA-C for today's virtual visit.  While this provider is not your primary care provider (PCP), if your PCP is located in our provider database this encounter information will be shared with them immediately following your visit.   A [facility] MyChart account gives you access to today's visit and all your visits, tests, and labs performed at [facility]  click here if you don't have a [facility] MyChart account or go to [email or web address]  Consent: (Patient) Edward Booth provided verbal consent for this virtual visit at the beginning of the encounter.  Current Medications:  Current Outpatient Medications:    ibuprofen  (ADVIL ) 800 MG tablet, Take 1 tablet (800 mg total) by mouth every 8 (eight) hours as needed., Disp: 30 tablet, Rfl: 0   sulfamethoxazole -trimethoprim  (BACTRIM  DS) 800-160 MG tablet, Take 1 tablet by mouth 2 (two) times daily., Disp: 20 tablet, Rfl: 0   acetaminophen  (TYLENOL ) 325 MG tablet, Take 2 tablets (650 mg total) by mouth every 6 (six) hours as needed for mild pain (or Fever >/= 101)., Disp: 12 tablet, Rfl: 0   blood glucose meter kit and supplies, [employer] Prime or Dispense other brand based on patient and insurance preference. Use up to four times daily as directed. (FOR ICD-9 250.00, 250.01)., Disp: 1 each, Rfl: 3   metFORMIN  (GLUCOPHAGE ) 500 MG tablet, Take 2 tablets (1,000 mg total) by mouth 2 (two) times daily with a meal., Disp: 60 tablet, Rfl: 11   nicotine  (NICODERM CQ  - DOSED IN MG/24 HOURS) 21 mg/24hr patch, Place 1 patch (21 mg total) onto the skin daily., Disp: 28 patch, Rfl: 0   oxyCODONE -acetaminophen  (PERCOCET/ROXICET) 5-325 MG tablet, Take 2 tablets by mouth every 6 (six) hours as needed. for pain, Disp: , Rfl:    Medications ordered in this encounter:  Meds ordered this encounter  Medications   sulfamethoxazole -trimethoprim  (BACTRIM  DS) 800-160 MG tablet    Sig: Take 1  tablet by mouth 2 (two) times daily.    Dispense:  20 tablet    Refill:  0    Supervising Provider:   LAMPTEY, PHILIP O [[id number]]   ibuprofen  (ADVIL ) 800 MG tablet    Sig: Take 1 tablet (800 mg total) by mouth every 8 (eight) hours as needed.    Dispense:  30 tablet    Refill:  0    Supervising Provider:   LAMPTEY, PHILIP O [[id number]]     *If you need refills on other medications prior to your next appointment, please contact your pharmacy*  Follow-Up: Call back or seek an in-person evaluation if the symptoms worsen or if the condition fails to improve as anticipated.  [facility] [phone]  Other Instructions Hidradenitis Suppurativa Hidradenitis suppurativa is a long-term (chronic) skin disease. It is similar to a severe form of acne, but it affects areas of the body where acne would be unusual, especially areas of the body where skin rubs against skin and becomes moist. These include: Underarms. Groin. Genital area. Buttocks. Upper thighs. Breasts. Hidradenitis suppurativa may start out as small lumps or pimples caused by blocked skin pores, sweat glands, or hair follicles. Pimples may develop into deep sores that break open (rupture) and drain pus. Over time, affected areas of skin may thicken and become scarred. This condition is rare and does not spread from person to person (non-contagious). What are the causes? The exact cause of this condition is not known. It may be related  to: Male and male hormones. An overactive disease-fighting system (immune system). The immune system may over-react to blocked hair follicles or sweat glands and cause swelling and pus-filled sores. What increases the risk? You are more likely to develop this condition if you: Are male. Are 47-78 years old. Have a family history of hidradenitis suppurativa. Have a personal history of acne. Are overweight. Smoke. Take the medicine lithium. What are the signs or symptoms? The  first symptoms are usually painful bumps in the skin, similar to pimples. The condition may get worse over time (progress), or it may only cause mild symptoms. If the disease progresses, symptoms may include: Skin bumps getting bigger and growing deeper into the skin. Bumps rupturing and draining pus. Itchy, infected skin. Skin getting thicker and scarred. Tunnels under the skin (fistulas) where pus drains from a bump. Pain during daily activities, such as pain during walking if your groin area is affected. Emotional problems, such as stress or depression. This condition may affect your appearance and your ability or willingness to wear certain clothes or do certain activities. How is this diagnosed? This condition is diagnosed by a health care provider who specializes in skin conditions (dermatologist). You may be diagnosed based on: Your symptoms and medical history. A physical exam. Testing a pus sample for infection. Blood tests. How is this treated? Your treatment will depend on how severe your symptoms are. The same treatment will not work for everybody with this condition. You may need to try several treatments to find what works best for you. Treatment may include: Cleaning and bandaging (dressing) your wounds as needed. Lifestyle changes, such as new skin care routines. Taking medicines, such as: Antibiotics. Acne medicines. Medicines to reduce the activity of the immune system. A diabetes medicine (metformin ). Birth control pills, for women. Steroids to reduce swelling and pain. Working with a mental health care provider, if you experience emotional distress due to this condition. If you have severe symptoms that do not get better with medicine, you may need surgery. Surgery may involve: Using a laser to clear the skin and remove hair follicles. Opening and draining deep sores. Removing the areas of skin that are diseased and scarred. Follow these instructions at  home: Medicines  Take over-the-counter and prescription medicines only as told by your health care provider. If you were prescribed antibiotics, take them as told by your health care provider. Do not stop using the antibiotic even if your condition improves. Skin care If you have open wounds, cover them with a clean dressing as told by your health care provider. Keep wounds clean by washing them gently with soap and water when you bathe. Do not shave the areas where you get hidradenitis suppurativa. Wear loose-fitting clothes. Try to avoid getting overheated or sweaty. If you get sweaty or wet, change into clean, dry clothes as soon as you can. To help relieve pain and itchiness, cover sore areas with a warm, clean washcloth (warm compress) for 5-10 minutes as often as needed. Your healthcare provider may recommend an antiperspirant deodorant that may be gentle on your skin. A daily antiseptic wash to cleanse affected areas may be suggested by your healthcare provider. General instructions Learn as much as you can about your disease so that you have an active role in your treatment. Work closely with your health care provider to find treatments that work for you. If you are overweight, work with your health care provider to lose weight as recommended. Do not use any  products that contain nicotine  or tobacco. These products include cigarettes, chewing tobacco, and vaping devices, such as e-cigarettes. If you need help quitting, ask your health care provider. If you struggle with living with this condition, talk with your health care provider or work with a mental health care provider as recommended. Keep all follow-up visits. Where to find more information Hidradenitis Suppurativa Foundation, Inc.: www.hs-foundation.org American Academy of Dermatology: InfoExam.si Contact a health care provider if: You have a flare-up of hidradenitis suppurativa. You have a fever or chills. You have trouble  controlling your symptoms at home. You have trouble doing your daily activities because of your symptoms. You have trouble dealing with emotional problems related to your condition. Summary Hidradenitis suppurativa is a long-term (chronic) skin disease. It is similar to a severe form of acne, but it affects areas of the body where acne would be unusual. The first symptoms are usually painful bumps in the skin, similar to pimples. The condition may only cause mild symptoms, or it may get worse over time (progress). If you have open wounds, cover them with a clean dressing as told by your health care provider. Keep wounds clean by washing them gently with soap and water when you bathe. Besides skin care, treatment may include medicines, laser treatment, and surgery. This information is not intended to replace advice given to you by your health care provider. Make sure you discuss any questions you have with your health care provider. Document Revised: 06/12/2021 Document Reviewed: 06/12/2021 Elsevier Patient Education  2024 Elsevier Inc.   If you have been instructed to have an in-person evaluation today at a local Urgent Care facility, please use the link below. It will take you to a list of all of our available [facility], including address, phone number and hours of operation. Please do not delay care.  [facility]  If you or a family member do not have a primary care provider, use the link below to schedule a visit and establish care. When you choose a [facility] primary care physician or advanced practice provider, you gain a long-term partner in health. Find a Primary Care Provider  Learn more about [facility]'s in-office and virtual care options: [facility] - Get Care Now

## 2023-11-16 NOTE — Progress Notes (Signed)
 Virtual Visit Consent   ACESON LABELL, you are scheduled for a virtual visit with a [facility] provider today. Just as with appointments in the office, your consent must be obtained to participate. Your consent will be active for this visit and any virtual visit you may have with one of our providers in the next 365 days. If you have a MyChart account, a copy of this consent can be sent to you electronically.  As this is a virtual visit, video technology does not allow for your provider to perform a traditional examination. This may limit your provider's ability to fully assess your condition. If your provider identifies any concerns that need to be evaluated in person or the need to arrange testing (such as labs, EKG, etc.), we will make arrangements to do so. Although advances in technology are sophisticated, we cannot ensure that it will always work on either your end or our end. If the connection with a video visit is poor, the visit may have to be switched to a telephone visit. With either a video or telephone visit, we are not always able to ensure that we have a secure connection.  By engaging in this virtual visit, you consent to the provision of healthcare and authorize for your insurance to be billed (if applicable) for the services provided during this visit. Depending on your insurance coverage, you may receive a charge related to this service.  I need to obtain your verbal consent now. Are you willing to proceed with your visit today? Edward Booth has provided verbal consent on 11/16/2023 for a virtual visit (video or telephone). Delon CHRISTELLA Dickinson, PA-C  Date: 11/16/2023 5:42 PM   Virtual Visit via Video Note   I, Delon CHRISTELLA Dickinson, connected with  Edward Booth  ([id number], [birth year]) on 11/16/23 at  5:45 PM EDT by a video-enabled telemedicine application and verified that I am speaking with the correct person using two identifiers.  Location: Patient: Virtual Visit  Location Patient: Home Provider: Virtual Visit Location Provider: Home Office   I discussed the limitations of evaluation and management by telemedicine and the availability of in person appointments. The patient expressed understanding and agreed to proceed.    History of Present Illness: Edward Booth is a 43 y.o. who identifies as a male who was assigned male at birth, and is being seen today for abscess in left buttocks at the gluteal fold. Also having abscesses in the perineal region, on the posterior testicular region, and one in the pilonidal area. Has history of HS. Current flare started about 5 days. Has been trying Hibiclens  wash, antibiotic ointments, drawing salve, epsom salt soaks, and Ibuprofen  without relief. Is causing a lot of pain and having issues sitting. Some are draining on their own now and do have a purulent discharge with a foul odor. Denies fevers, chills, nausea, vomiting.   Problems:  Patient Active Problem List   Diagnosis Date Noted   Type 2 diabetes mellitus without complication (HCC) 02/07/2019   Acute respiratory failure with hypoxia (HCC) 02/05/2019   Obesity, Class III, BMI 40-49.9 (morbid obesity) 02/05/2019   Hyperglycemia 02/05/2019   Leukocytosis 02/05/2019   Dyspnea 02/04/2019   CAP (community acquired pneumonia) 02/04/2019   Chest pain 02/04/2019   PNA (pneumonia) 02/04/2019    Allergies:  Allergies  Allergen Reactions   Aspartame And Phenylalanine Anaphylaxis   Medications:  Current Outpatient Medications:    ibuprofen  (ADVIL ) 800 MG tablet, Take 1 tablet (800 mg total)  by mouth every 8 (eight) hours as needed., Disp: 30 tablet, Rfl: 0   sulfamethoxazole -trimethoprim  (BACTRIM  DS) 800-160 MG tablet, Take 1 tablet by mouth 2 (two) times daily., Disp: 20 tablet, Rfl: 0   acetaminophen  (TYLENOL ) 325 MG tablet, Take 2 tablets (650 mg total) by mouth every 6 (six) hours as needed for mild pain (or Fever >/= 101)., Disp: 12 tablet, Rfl: 0   blood  glucose meter kit and supplies, [employer] or Dispense other brand based on patient and insurance preference. Use up to four times daily as directed. (FOR ICD-9 250.00, 250.01)., Disp: 1 each, Rfl: 3   metFORMIN  (GLUCOPHAGE ) 500 MG tablet, Take 2 tablets (1,000 mg total) by mouth 2 (two) times daily with a meal., Disp: 60 tablet, Rfl: 11   nicotine  (NICODERM CQ  - DOSED IN MG/24 HOURS) 21 mg/24hr patch, Place 1 patch (21 mg total) onto the skin daily., Disp: 28 patch, Rfl: 0   oxyCODONE -acetaminophen  (PERCOCET/ROXICET) 5-325 MG tablet, Take 2 tablets by mouth every 6 (six) hours as needed. for pain, Disp: , Rfl:   Observations/Objective: Patient is well-developed, well-nourished in no acute distress.  Resting comfortably at home.  Head is normocephalic, atraumatic.  No labored breathing.  Speech is clear and coherent with logical content.  Patient is alert and oriented at baseline.    Assessment and Plan: 1. Hidradenitis suppurativa of anus (Primary) - sulfamethoxazole -trimethoprim  (BACTRIM  DS) 800-160 MG tablet; Take 1 tablet by mouth 2 (two) times daily.  Dispense: 20 tablet; Refill: 0 - ibuprofen  (ADVIL ) 800 MG tablet; Take 1 tablet (800 mg total) by mouth every 8 (eight) hours as needed.  Dispense: 30 tablet; Refill: 0  - Bactrim  prescribed - Ibuprofen  800mg  prescribed, can alternate with Tylenol  every 4 hours - Continue Hibiclens  washes, Epsom salt soaks - Discussed bleach bath with using no more than a half a cup of bleach in a full bathtub - Donut seat may help off load pressure with sitting - Work note provided - Seek in person evaluation if not improving or if symptoms worsen  Follow Up Instructions: I discussed the assessment and treatment plan with the patient. The patient was provided an opportunity to ask questions and all were answered. The patient agreed with the plan and demonstrated an understanding of the instructions.  A copy of instructions were sent to the patient  via MyChart unless otherwise noted below.    The patient was advised to call back or seek an in-person evaluation if the symptoms worsen or if the condition fails to improve as anticipated.    Delon CHRISTELLA Dickinson, PA-C

## 2023-11-17 ENCOUNTER — Inpatient Hospital Stay (HOSPITAL_COMMUNITY)
Admission: EM | Admit: 2023-11-17 | Discharge: 2023-11-22 | DRG: 717 | Disposition: A | Discharge status: Home / Self Care | Attending: Family Medicine | Admitting: Family Medicine

## 2023-11-17 ENCOUNTER — Other Ambulatory Visit: Payer: Self-pay

## 2023-11-17 ENCOUNTER — Inpatient Hospital Stay (HOSPITAL_COMMUNITY)

## 2023-11-17 ENCOUNTER — Encounter (HOSPITAL_COMMUNITY)
Admission: EM | Disposition: A | Discharge status: Home / Self Care | Payer: Self-pay | Source: Home / Self Care | Attending: Internal Medicine

## 2023-11-17 ENCOUNTER — Emergency Department (HOSPITAL_COMMUNITY)

## 2023-11-17 ENCOUNTER — Encounter (HOSPITAL_COMMUNITY): Payer: Self-pay | Admitting: Certified Registered Nurse Anesthetist

## 2023-11-17 DIAGNOSIS — Z8249 Family history of ischemic heart disease and other diseases of the circulatory system: Secondary | ICD-10-CM

## 2023-11-17 DIAGNOSIS — F17210 Nicotine dependence, cigarettes, uncomplicated: Secondary | ICD-10-CM | POA: Diagnosis present

## 2023-11-17 DIAGNOSIS — Z7984 Long term (current) use of oral hypoglycemic drugs: Secondary | ICD-10-CM

## 2023-11-17 DIAGNOSIS — E1165 Type 2 diabetes mellitus with hyperglycemia: Secondary | ICD-10-CM | POA: Diagnosis present

## 2023-11-17 DIAGNOSIS — L0231 Cutaneous abscess of buttock: Secondary | ICD-10-CM | POA: Diagnosis present

## 2023-11-17 DIAGNOSIS — N493 Fournier gangrene: Principal | ICD-10-CM | POA: Diagnosis present

## 2023-11-17 DIAGNOSIS — E8770 Fluid overload, unspecified: Secondary | ICD-10-CM | POA: Diagnosis present

## 2023-11-17 DIAGNOSIS — E876 Hypokalemia: Secondary | ICD-10-CM | POA: Diagnosis present

## 2023-11-17 DIAGNOSIS — L732 Hidradenitis suppurativa: Secondary | ICD-10-CM | POA: Diagnosis present

## 2023-11-17 DIAGNOSIS — L0889 Other specified local infections of the skin and subcutaneous tissue: Secondary | ICD-10-CM | POA: Diagnosis not present

## 2023-11-17 DIAGNOSIS — I96 Gangrene, not elsewhere classified: Secondary | ICD-10-CM | POA: Diagnosis not present

## 2023-11-17 DIAGNOSIS — Z794 Long term (current) use of insulin: Secondary | ICD-10-CM

## 2023-11-17 DIAGNOSIS — N3289 Other specified disorders of bladder: Secondary | ICD-10-CM | POA: Diagnosis not present

## 2023-11-17 DIAGNOSIS — Z6837 Body mass index (BMI) 37.0-37.9, adult: Secondary | ICD-10-CM

## 2023-11-17 DIAGNOSIS — E1169 Type 2 diabetes mellitus with other specified complication: Secondary | ICD-10-CM | POA: Diagnosis not present

## 2023-11-17 DIAGNOSIS — E871 Hypo-osmolality and hyponatremia: Secondary | ICD-10-CM | POA: Diagnosis present

## 2023-11-17 DIAGNOSIS — E66812 Obesity, class 2: Secondary | ICD-10-CM | POA: Diagnosis present

## 2023-11-17 DIAGNOSIS — D72829 Elevated white blood cell count, unspecified: Secondary | ICD-10-CM | POA: Diagnosis not present

## 2023-11-17 DIAGNOSIS — R509 Fever, unspecified: Secondary | ICD-10-CM | POA: Diagnosis not present

## 2023-11-17 DIAGNOSIS — N50819 Testicular pain, unspecified: Secondary | ICD-10-CM | POA: Diagnosis present

## 2023-11-17 HISTORY — PX: SCROTAL EXPLORATION: SHX2386

## 2023-11-17 LAB — CBC WITH DIFFERENTIAL/PLATELET
Abs Immature Granulocytes: 0.23 K/uL — ABNORMAL HIGH (ref 0.00–0.07)
Basophils Absolute: 0.1 K/uL (ref 0.0–0.1)
Basophils Relative: 0 %
Eosinophils Absolute: 0.1 K/uL (ref 0.0–0.5)
Eosinophils Relative: 0 %
HCT: 47.5 % (ref 39.0–52.0)
Hemoglobin: 17 g/dL (ref 13.0–17.0)
Immature Granulocytes: 1 %
Lymphocytes Relative: 8 %
Lymphs Abs: 1.5 K/uL (ref 0.7–4.0)
MCH: 31.5 pg (ref 26.0–34.0)
MCHC: 35.8 g/dL (ref 30.0–36.0)
MCV: 88 fL (ref 80.0–100.0)
Monocytes Absolute: 0.9 K/uL (ref 0.1–1.0)
Monocytes Relative: 4 %
Neutro Abs: 17.2 K/uL — ABNORMAL HIGH (ref 1.7–7.7)
Neutrophils Relative %: 87 %
Platelets: 235 K/uL (ref 150–400)
RBC: 5.4 MIL/uL (ref 4.22–5.81)
RDW: 13.1 % (ref 11.5–15.5)
WBC: 19.9 K/uL — ABNORMAL HIGH (ref 4.0–10.5)
nRBC: 0 % (ref 0.0–0.2)

## 2023-11-17 LAB — COMPREHENSIVE METABOLIC PANEL WITH GFR
ALT: 17 U/L (ref 0–44)
AST: 16 U/L (ref 15–41)
Albumin: 3.2 g/dL — ABNORMAL LOW (ref 3.5–5.0)
Alkaline Phosphatase: 87 U/L (ref 38–126)
Anion gap: 9 (ref 5–15)
BUN: 8 mg/dL (ref 6–20)
CO2: 22 mmol/L (ref 22–32)
Calcium: 9.1 mg/dL (ref 8.9–10.3)
Chloride: 100 mmol/L (ref 98–111)
Creatinine, Ser: 0.9 mg/dL (ref 0.61–1.24)
GFR, Estimated: >60 mL/min (ref >60)
Glucose, Bld: 317 mg/dL — ABNORMAL HIGH (ref 70–99)
Potassium: 3.8 mmol/L (ref 3.5–5.1)
Sodium: 131 mmol/L — ABNORMAL LOW (ref 135–145)
Total Bilirubin: 0.7 mg/dL (ref 0.0–1.2)
Total Protein: 6.9 g/dL (ref 6.5–8.1)

## 2023-11-17 LAB — I-STAT CG4 LACTIC ACID, ED
Lactic Acid, Venous: 1.5 mmol/L (ref 0.5–1.9)
Lactic Acid, Venous: 1 mmol/L (ref 0.5–1.9)

## 2023-11-17 SURGERY — EXPLORATION, SCROTUM
Anesthesia: General

## 2023-11-17 MED ORDER — DEXAMETHASONE SODIUM PHOSPHATE 10 MG/ML IJ SOLN
INTRAMUSCULAR | Status: AC
  Filled 2023-11-17: qty 1

## 2023-11-17 MED ORDER — LIDOCAINE 2% (20 MG/ML) 5 ML SYRINGE
INTRAMUSCULAR | Status: AC
  Filled 2023-11-17: qty 5

## 2023-11-17 MED ORDER — PROPOFOL 10 MG/ML IV BOLUS
INTRAVENOUS | Status: AC
  Filled 2023-11-17: qty 20

## 2023-11-17 MED ORDER — PROPOFOL 10 MG/ML IV BOLUS
INTRAVENOUS | Status: DC | PRN
Start: 2023-11-17 — End: 2023-11-18
  Administered 2023-11-17: 200 mg via INTRAVENOUS

## 2023-11-17 MED ORDER — ONDANSETRON HCL 4 MG/2ML IJ SOLN
INTRAMUSCULAR | Status: AC
  Filled 2023-11-17: qty 2

## 2023-11-17 MED ORDER — FENTANYL CITRATE (PF) 250 MCG/5ML IJ SOLN
INTRAMUSCULAR | Status: AC
  Filled 2023-11-17: qty 5

## 2023-11-17 MED ORDER — OXYCODONE HCL 5 MG/5ML PO SOLN: 5.0000 mg | Freq: Once | ORAL | Status: DC | PRN

## 2023-11-17 MED ORDER — SODIUM CHLORIDE 0.9 % IV BOLUS
1000.0000 mL | Freq: Once | INTRAVENOUS | Status: AC
  Administered 2023-11-17: 1000 mL via INTRAVENOUS

## 2023-11-17 MED ORDER — LACTATED RINGERS IV SOLN: INTRAVENOUS | Status: DC | PRN

## 2023-11-17 MED ORDER — FENTANYL CITRATE (PF) 250 MCG/5ML IJ SOLN
INTRAMUSCULAR | Status: DC | PRN
  Administered 2023-11-17: 100 ug via INTRAVENOUS
  Administered 2023-11-17 – 2023-11-18 (×3): 50 ug via INTRAVENOUS

## 2023-11-17 MED ORDER — CLINDAMYCIN PHOSPHATE 300 MG/50ML IV SOLN
300.0000 mg | Freq: Once | INTRAVENOUS | Status: AC
  Administered 2023-11-17: 300 mg via INTRAVENOUS
  Filled 2023-11-17: qty 50

## 2023-11-17 MED ORDER — FENTANYL CITRATE (PF) 100 MCG/2ML IJ SOLN: 25.0000 ug | INTRAMUSCULAR | Status: DC | PRN

## 2023-11-17 MED ORDER — MIDAZOLAM HCL 2 MG/2ML IJ SOLN
INTRAMUSCULAR | Status: DC | PRN
Start: 2023-11-17 — End: 2023-11-18
  Administered 2023-11-17: 2 mg via INTRAVENOUS

## 2023-11-17 MED ORDER — ACETAMINOPHEN 10 MG/ML IV SOLN
INTRAVENOUS | Status: AC
Start: 2023-11-17 — End: 2023-11-17
  Filled 2023-11-17: qty 100

## 2023-11-17 MED ORDER — MIDAZOLAM HCL 2 MG/2ML IJ SOLN
INTRAMUSCULAR | Status: AC
Start: 2023-11-17 — End: 2023-11-17
  Filled 2023-11-17: qty 2

## 2023-11-17 MED ORDER — VANCOMYCIN HCL 2000 MG/400ML IV SOLN
2000.0000 mg | Freq: Once | INTRAVENOUS | Status: DC
  Filled 2023-11-17: qty 400

## 2023-11-17 MED ORDER — ACETAMINOPHEN 10 MG/ML IV SOLN
INTRAVENOUS | Status: DC | PRN
Start: 2023-11-17 — End: 2023-11-18
  Administered 2023-11-17: 1000 mg via INTRAVENOUS

## 2023-11-17 MED ORDER — IOHEXOL 350 MG/ML SOLN
75.0000 mL | Freq: Once | INTRAVENOUS | Status: AC | PRN
  Administered 2023-11-17: 75 mL via INTRAVENOUS

## 2023-11-17 MED ORDER — PHENYLEPHRINE 80 MCG/ML (10ML) SYRINGE FOR IV PUSH (FOR BLOOD PRESSURE SUPPORT)
PREFILLED_SYRINGE | INTRAVENOUS | Status: AC
Start: 2023-11-17 — End: 2023-11-17
  Filled 2023-11-17: qty 20

## 2023-11-17 MED ORDER — SODIUM CHLORIDE 0.9 % IV BOLUS: 1000.0000 mL | Freq: Once | INTRAVENOUS | Status: DC

## 2023-11-17 MED ORDER — ONDANSETRON HCL 4 MG/2ML IJ SOLN: 4.0000 mg | Freq: Four times a day (QID) | INTRAMUSCULAR | Status: DC | PRN

## 2023-11-17 MED ORDER — SUGAMMADEX SODIUM 200 MG/2ML IV SOLN
INTRAVENOUS | Status: AC
  Filled 2023-11-17: qty 2

## 2023-11-17 MED ORDER — ROCURONIUM BROMIDE 10 MG/ML (PF) SYRINGE
PREFILLED_SYRINGE | INTRAVENOUS | Status: AC
  Filled 2023-11-17: qty 10

## 2023-11-17 MED ORDER — DEXMEDETOMIDINE HCL IN NACL 80 MCG/20ML IV SOLN
INTRAVENOUS | Status: DC | PRN
  Administered 2023-11-17: 12 ug via INTRAVENOUS
  Administered 2023-11-17: 8 ug via INTRAVENOUS

## 2023-11-17 MED ORDER — LIDOCAINE 2% (20 MG/ML) 5 ML SYRINGE
INTRAMUSCULAR | Status: DC | PRN
  Administered 2023-11-17: 60 mg via INTRAVENOUS

## 2023-11-17 MED ORDER — SUCCINYLCHOLINE CHLORIDE 200 MG/10ML IV SOSY
PREFILLED_SYRINGE | INTRAVENOUS | Status: DC | PRN
  Administered 2023-11-17: 140 mg via INTRAVENOUS

## 2023-11-17 MED ORDER — EPHEDRINE 5 MG/ML INJ
INTRAVENOUS | Status: AC
  Filled 2023-11-17: qty 5

## 2023-11-17 MED ORDER — MORPHINE SULFATE (PF) 4 MG/ML IV SOLN
4.0000 mg | Freq: Once | INTRAVENOUS | Status: AC
  Administered 2023-11-17: 4 mg via INTRAVENOUS
  Filled 2023-11-17: qty 1

## 2023-11-17 MED ORDER — MIDAZOLAM HCL 2 MG/2ML IJ SOLN
INTRAMUSCULAR | Status: AC
  Filled 2023-11-17: qty 2

## 2023-11-17 MED ORDER — 0.9 % SODIUM CHLORIDE (POUR BTL) OPTIME
TOPICAL | Status: DC | PRN
Start: 2023-11-17 — End: 2023-11-18
  Administered 2023-11-17 (×2): 1000 mL

## 2023-11-17 MED ORDER — VANCOMYCIN HCL 10 G IV SOLR
2500.0000 mg | Freq: Once | INTRAVENOUS | Status: AC
  Administered 2023-11-17: 2500 mg via INTRAVENOUS
  Filled 2023-11-17: qty 2500

## 2023-11-17 MED ORDER — DEXMEDETOMIDINE HCL IN NACL 80 MCG/20ML IV SOLN
INTRAVENOUS | Status: AC
Start: 2023-11-17 — End: 2023-11-17
  Filled 2023-11-17: qty 20

## 2023-11-17 MED ORDER — SUCCINYLCHOLINE CHLORIDE 200 MG/10ML IV SOSY
PREFILLED_SYRINGE | INTRAVENOUS | Status: AC
  Filled 2023-11-17: qty 10

## 2023-11-17 MED ORDER — PIPERACILLIN-TAZOBACTAM 3.375 G IVPB 30 MIN
3.3750 g | Freq: Once | INTRAVENOUS | Status: AC
  Administered 2023-11-17: 3.375 g via INTRAVENOUS
  Filled 2023-11-17: qty 50

## 2023-11-17 MED ORDER — ROCURONIUM BROMIDE 10 MG/ML (PF) SYRINGE
PREFILLED_SYRINGE | INTRAVENOUS | Status: DC | PRN
  Administered 2023-11-17: 20 mg via INTRAVENOUS
  Administered 2023-11-17: 40 mg via INTRAVENOUS
  Administered 2023-11-17: 20 mg via INTRAVENOUS

## 2023-11-17 MED ORDER — DEXAMETHASONE SODIUM PHOSPHATE 10 MG/ML IJ SOLN
INTRAMUSCULAR | Status: DC | PRN
Start: 2023-11-17 — End: 2023-11-18
  Administered 2023-11-17: 5 mg via INTRAVENOUS

## 2023-11-17 MED ORDER — ONDANSETRON HCL 4 MG/2ML IJ SOLN
4.0000 mg | Freq: Once | INTRAMUSCULAR | Status: AC
  Administered 2023-11-17: 4 mg via INTRAVENOUS
  Filled 2023-11-17: qty 2

## 2023-11-17 MED ORDER — OXYCODONE HCL 5 MG PO TABS: 5.0000 mg | ORAL_TABLET | Freq: Once | ORAL | Status: DC | PRN

## 2023-11-17 SURGICAL SUPPLY — 27 items
BAG COUNTER SPONGE SURGICOUNT (BAG) IMPLANT
BLADE CLIPPER SURG (BLADE) ×1 IMPLANT
BLADE HEX COATED 2.75 (ELECTRODE) ×1 IMPLANT
BNDG GAUZE DERMACEA FLUFF 4 (GAUZE/BANDAGES/DRESSINGS) ×1 IMPLANT
COVER SURGICAL LIGHT HANDLE (MISCELLANEOUS) ×1 IMPLANT
DERMABOND ADVANCED .7 DNX12 (GAUZE/BANDAGES/DRESSINGS) ×1 IMPLANT
DRAIN PENROSE 0.25X18 (DRAIN) IMPLANT
DRAPE LAPAROTOMY 100X72 PEDS (DRAPES) ×1 IMPLANT
ELECT REM PT RETURN 15FT ADLT (MISCELLANEOUS) ×1 IMPLANT
GLOVE BIO SURGEON STRL SZ7.5 (GLOVE) ×2 IMPLANT
GOWN STRL REUS W/ TWL LRG LVL3 (GOWN DISPOSABLE) ×1 IMPLANT
KIT BASIN OR (CUSTOM PROCEDURE TRAY) ×1 IMPLANT
NDL HYPO 22X1.5 SAFETY MO (MISCELLANEOUS) IMPLANT
NEEDLE HYPO 22X1.5 SAFETY MO (MISCELLANEOUS) ×1 IMPLANT
NS IRRIG 1000ML POUR BTL (IV SOLUTION) ×1 IMPLANT
PACK GENERAL/GYN (CUSTOM PROCEDURE TRAY) ×1 IMPLANT
PACK LITHOTOMY IV (CUSTOM PROCEDURE TRAY) IMPLANT
PAD ABD 8X10 STRL (GAUZE/BANDAGES/DRESSINGS) IMPLANT
SOL PREP POV-IOD 4OZ 10% (MISCELLANEOUS) ×1 IMPLANT
SUPPORT SCROTAL LG STRP (MISCELLANEOUS) ×1 IMPLANT
SUT CHROMIC 3 0 SH 27 (SUTURE) ×2 IMPLANT
SUT VIC AB 2-0 UR5 27 (SUTURE) IMPLANT
SUT VICRYL 0 TIES 12 18 (SUTURE) IMPLANT
SYR CONTROL 10ML LL (SYRINGE) IMPLANT
TOWEL GREEN STERILE (TOWEL DISPOSABLE) ×2 IMPLANT
TRAY FOLEY W/BAG SLVR 16FR ST (SET/KITS/TRAYS/PACK) IMPLANT
WATER STERILE IRR 1000ML POUR (IV SOLUTION) ×1 IMPLANT

## 2023-11-17 NOTE — Consult Note (Addendum)
 H&P Physician requesting consult: Fairy Gravely  Chief Complaint: Fournier's gangrene  History of Present Illness: 43 year old male with a history of hidradenitis that has required incision and drainage in the past.  For the past week he has had a buttock lesion with some pain and drainage.  Was seen for telehealth and was prescribed Bactrim  yesterday.  Over the past 24 hours, had severe pain and swelling in the left groin.  This prompted visit to the emergency department.  CT scan was performed that showed air in the groin concerning for gangrene and a left epididymal cyst.  He denies any significant scrotal testicular pain.  Pain is primarily in the groin.  Not having much pain.  Labs were notable for leukocytosis of 19.9, glucose of 317, sodium 131.  Urine cultures and blood cultures are pending.He has received clindamycin  and Zosyn  and vancomycin  is pending.  He has had fevers at home.  He has some tachycardia. A Little hypertensive.  His not aware that he is diabetic.  Has not seen a doctor in a full of years.   Past Medical History:  Diagnosis Date   Dysuria    Family history of adverse reaction to anesthesia    mother-- ponv   Headache    Hematuria    Left ureteral stone    Nephrolithiasis    bilateral non-obstructive per CT 09-10-2017   Urgency of urination    Past Surgical History:  Procedure Laterality Date   CYSTOSCOPY W/ URETERAL STENT PLACEMENT Left 09/21/2017   Procedure: CYSTOSCOPY WITH RETROGRADE PYELOGRAM/URETERAL STENT PLACEMENT;  Surgeon: Carolee Sherwood JONETTA DOUGLAS, MD;  Location: [facility];  Service: Urology;  Laterality: Left;   CYSTOSCOPY WITH RETROGRADE PYELOGRAM, URETEROSCOPY AND STENT PLACEMENT Left 10/07/2017   Procedure: CYSTOSCOPY WITH LEFT  RETROGRADE PYELOGRAM, URETEROSCOPY ,LITHROTRIPSY.  AND STENT REPLACEMENT;  Surgeon: Carolee Sherwood JONETTA DOUGLAS, MD;  Location: [facility];  Service: Urology;  Laterality: Left;   HOLMIUM LASER APPLICATION Left 10/07/2017    Procedure: HOLMIUM LASER APPLICATION;  Surgeon: Carolee Sherwood JONETTA DOUGLAS, MD;  Location: [facility];  Service: Urology;  Laterality: Left;   INGUINAL HERNIA REPAIR Left 1986    Home Medications:  (Not in a hospital admission)  Allergies:  Allergies  Allergen Reactions   Aspartame And Phenylalanine Anaphylaxis    Family History  Problem Relation Age of Onset   CAD Mother    Kidney failure Mother    Social History:  reports that he has been smoking cigarettes. He has a 20 pack-year smoking history. He has never used smokeless tobacco. He reports current alcohol use. He reports that he does not use drugs.  ROS: A complete review of systems was performed.  All systems are negative except for pertinent findings as noted. ROS   Physical Exam:  Vital signs in last 24 hours: Temp:  [98.6 F (37 C)-100.2 F (37.9 C)] 100.2 F (37.9 C) (07/15 2006) Pulse Rate:  [108-116] 108 (07/15 2006) Resp:  [18-20] 20 (07/15 2006) BP: (138-165)/(100-101) 138/101 (07/15 2006) SpO2:  [99 %-100 %] 100 % (07/15 2006) General:  Alert and oriented, No acute distress HEENT: Normocephalic, atraumatic Neck: No JVD or lymphadenopathy Cardiovascular: Regular rate and rhythm Lungs: Regular rate and effort Abdomen: Soft, nontender, nondistended, no abdominal masses Back: No CVA tenderness Genitourinary: Circumcised phallus.  Scrotum with some erythema and left hemiscrotal swelling consistent with a large epididymal cyst.  He has no crepitus or fluctuance of the scrotum itself.  Left groin superior to  the scrotum has fluctuance and crepitus consistent with Fournier's gangrene.  No skin changes yet to suggest skin necrosis.  Left buttock he has an area that appears to be hidradenitis with some overlying crepitus. Extremities: No edema Neurologic: Grossly intact  Laboratory Data:  Results for orders placed or performed during the hospital encounter of 11/17/23 (from the past 24 hours)  CBC with  Differential     Status: Abnormal   Collection Time: 11/17/23  6:10 PM  Result Value Ref Range   WBC 19.9 (H) 4.0 - 10.5 K/uL   RBC 5.40 4.22 - 5.81 MIL/uL   Hemoglobin 17.0 13.0 - 17.0 g/dL   HCT 52.4 60.9 - 47.9 %   MCV 88.0 80.0 - 100.0 fL   MCH 31.5 26.0 - 34.0 pg   MCHC 35.8 30.0 - 36.0 g/dL   RDW 86.8 88.4 - 84.4 %   Platelets 235 150 - 400 K/uL   nRBC 0.0 0.0 - 0.2 %   Neutrophils Relative % 87 %   Neutro Abs 17.2 (H) 1.7 - 7.7 K/uL   Lymphocytes Relative 8 %   Lymphs Abs 1.5 0.7 - 4.0 K/uL   Monocytes Relative 4 %   Monocytes Absolute 0.9 0.1 - 1.0 K/uL   Eosinophils Relative 0 %   Eosinophils Absolute 0.1 0.0 - 0.5 K/uL   Basophils Relative 0 %   Basophils Absolute 0.1 0.0 - 0.1 K/uL   Immature Granulocytes 1 %   Abs Immature Granulocytes 0.23 (H) 0.00 - 0.07 K/uL  Comprehensive metabolic panel     Status: Abnormal   Collection Time: 11/17/23  6:10 PM  Result Value Ref Range   Sodium 131 (L) 135 - 145 mmol/L   Potassium 3.8 3.5 - 5.1 mmol/L   Chloride 100 98 - 111 mmol/L   CO2 22 22 - 32 mmol/L   Glucose, Bld 317 (H) 70 - 99 mg/dL   BUN 8 6 - 20 mg/dL   Creatinine, Ser 9.09 0.61 - 1.24 mg/dL   Calcium 9.1 8.9 - 89.6 mg/dL   Total Protein 6.9 6.5 - 8.1 g/dL   Albumin 3.2 (L) 3.5 - 5.0 g/dL   AST 16 15 - 41 U/L   ALT 17 0 - 44 U/L   Alkaline Phosphatase 87 38 - 126 U/L   Total Bilirubin 0.7 0.0 - 1.2 mg/dL   GFR, Estimated >39 >39 mL/min   Anion gap 9 5 - 15  I-Stat CG4 Lactic Acid     Status: None   Collection Time: 11/17/23  6:21 PM  Result Value Ref Range   Lactic Acid, Venous 1.5 0.5 - 1.9 mmol/L  I-Stat CG4 Lactic Acid     Status: None   Collection Time: 11/17/23  8:30 PM  Result Value Ref Range   Lactic Acid, Venous 1.0 0.5 - 1.9 mmol/L   No results found for this or any previous visit (from the past 240 hours). Creatinine: Recent Labs    11/17/23 1810  CREATININE 0.90   Personally reviewed and detailed in history of present  illness.  Impression/Assessment:  Fournier's gangrene  Plan:  Proceed emergently to the operating room for incision and drainage and excisional debridement of groin, scrotum, buttock.  Risk benefits discussed and severity of his case discussed.  Bleeding, infection, and surrounding structures, need for additional procedures, need to take a large amount of tissue resulting in a large wound, need for prolonged course of healing and hospital stay discussed.  It does not appear to  be involving the scrotum yet.  I consulted general surgery, Dr. Dasie who will assist with the case with me.  60 minutes was spent on the case preoperatively including 50% with direct patient contact.  Sherwood JONETTA Edison, III 11/17/2023, 9:44 PM

## 2023-11-17 NOTE — ED Provider Triage Note (Signed)
 Emergency Medicine Provider Triage Evaluation Note  Edward Booth , a 43 y.o. male  was evaluated in triage.  Pt complains of left testicular swelling for the past 2 days.  Seen by video visit yesterday, given a prescription for Bactrim  which did cause some improvement.  Prior history of at bedtime, significant swelling to the left testicle.  Review of Systems  Positive: TESTICULAR PAIN, fever Negative: Abdominal pain  Physical Exam  There were no vitals taken for this visit. Gen:   Awake, no distress   Resp:  Normal effort  MSK:   Moves extremities without difficulty  Other:  Significant swelling to the left testicle   Medical Decision Making  Medically screening exam initiated at 6:04 PM.  Appropriate orders placed.  Edward Booth was informed that the remainder of the evaluation will be completed by another provider, this initial triage assessment does not replace that evaluation, and the importance of remaining in the ED until their evaluation is complete.     Zettie Gootee, PA-C 11/17/23 1807

## 2023-11-17 NOTE — Anesthesia Procedure Notes (Signed)
 Procedure Name: Intubation Date/Time: 11/17/2023 11:03 PM  Performed by: Roddie Grate, CRNAPre-anesthesia Checklist: Patient identified, Emergency Drugs available, Suction available, Patient being monitored and Timeout performed Patient Re-evaluated:Patient Re-evaluated prior to induction Oxygen Delivery Method: Circle system utilized Preoxygenation: Pre-oxygenation with 100% oxygen Induction Type: IV induction, Rapid sequence and Cricoid Pressure applied Laryngoscope Size: Mac and 4 Grade View: Grade I Tube type: Oral Tube size: 7.5 mm Number of attempts: 1 Airway Equipment and Method: Stylet Placement Confirmation: ETT inserted through vocal cords under direct vision, positive ETCO2 and breath sounds checked- equal and bilateral Secured at: 23 cm Tube secured with: Tape Dental Injury: Teeth and Oropharynx as per pre-operative assessment  Comments: Smooth IV Induction. Eyes taped. RSI performed. DL x 1 with grade 1 view. Atraumatically placed, teeth and lip remain intact as pre-op. Secured with tape. Bilateral breath sounds +/=, EtCO2 +, Adequate TV, VSS.

## 2023-11-17 NOTE — Progress Notes (Signed)
 ED Pharmacy Antibiotic Sign Off An antibiotic consult was received from an ED provider for vancomycin  per pharmacy dosing for sepsis. A chart review was completed to assess appropriateness.   The following one time order(s) were placed:  Vancomycin  2500 mg IV x 1  Further antibiotic and/or antibiotic pharmacy consults should be ordered by the admitting provider if indicated.   Thank you for allowing pharmacy to be a part of this patient's care.   Dorn Poot, [facility]  Clinical Pharmacist 11/17/23 8:09 PM

## 2023-11-17 NOTE — Anesthesia Preprocedure Evaluation (Signed)
 Anesthesia Evaluation  Patient identified by MRN, date of birth, ID band Patient awake    Reviewed: Allergy & Precautions, H&P , NPO status , Patient's Chart, lab work & pertinent test results  Airway Mallampati: II   Neck ROM: full    Dental   Pulmonary shortness of breath, Current Smoker   breath sounds clear to auscultation       Cardiovascular negative cardio ROS  Rhythm:regular Rate:Normal     Neuro/Psych  Headaches    GI/Hepatic   Endo/Other    Renal/GU stones     Musculoskeletal   Abdominal   Peds  Hematology   Anesthesia Other Findings   Reproductive/Obstetrics                              Anesthesia Physical Anesthesia Plan  ASA: 2  Anesthesia Plan: General   Post-op Pain Management:    Induction: Intravenous  PONV Risk Score and Plan: 1 and Ondansetron , Dexamethasone , Midazolam  and Treatment may vary due to age or medical condition  Airway Management Planned: Oral ETT  Additional Equipment:   Intra-op Plan:   Post-operative Plan: Extubation in OR  Informed Consent: I have reviewed the patients History and Physical, chart, labs and discussed the procedure including the risks, benefits and alternatives for the proposed anesthesia with the patient or authorized representative who has indicated his/her understanding and acceptance.     Dental advisory given  Plan Discussed with: CRNA, Anesthesiologist and Surgeon  Anesthesia Plan Comments:         Anesthesia Quick Evaluation

## 2023-11-17 NOTE — ED Provider Notes (Signed)
 [place] EMERGENCY DEPARTMENT AT [facility] Provider Note   CSN: [id number] Arrival date & time: 11/17/23  1757     Patient presents with: Fever and Abscess   Edward Booth is a 43 y.o. male.   43 year old male here today with testicular pain and swelling send ongoing for the last couple of days.  Has had fever at home.  Did a telehealth visit yesterday, prescribed Bactrim .  Comes in today for worsening pain, fever.  Has reported noises coming from scrotum.  History of HS.   Fever Abscess Associated symptoms: fever        Prior to Admission medications   Medication Sig Start Date End Date Taking? Authorizing Provider  acetaminophen  (TYLENOL ) 325 MG tablet Take 2 tablets (650 mg total) by mouth every 6 (six) hours as needed for mild pain (or Fever >/= 101). 02/07/19   Pearlean Manus, MD  blood glucose meter kit and supplies Relion Prime or Dispense other brand based on patient and insurance preference. Use up to four times daily as directed. (FOR ICD-9 250.00, 250.01). 02/07/19   Pearlean Manus, MD  ibuprofen  (ADVIL ) 800 MG tablet Take 1 tablet (800 mg total) by mouth every 8 (eight) hours as needed. 11/16/23   Vivienne Delon HERO, PA-C  metFORMIN  (GLUCOPHAGE ) 500 MG tablet Take 2 tablets (1,000 mg total) by mouth 2 (two) times daily with a meal. 02/07/19 02/07/20  Emokpae, Courage, MD  nicotine  (NICODERM CQ  - DOSED IN MG/24 HOURS) 21 mg/24hr patch Place 1 patch (21 mg total) onto the skin daily. 02/08/19   Pearlean Manus, MD  oxyCODONE -acetaminophen  (PERCOCET/ROXICET) 5-325 MG tablet Take 2 tablets by mouth every 6 (six) hours as needed. for pain 01/30/19   [provider]  sulfamethoxazole -trimethoprim  (BACTRIM  DS) 800-160 MG tablet Take 1 tablet by mouth 2 (two) times daily. 11/16/23   Vivienne Delon HERO, PA-C    Allergies: Aspartame and phenylalanine    Review of Systems  Constitutional:  Positive for fever.    Updated Vital Signs BP (!)  138/101 (BP Location: Right Arm)   Pulse (!) 108   Temp 100.2 F (37.9 C) (Oral)   Resp 20   SpO2 100%   Physical Exam Vitals reviewed.  Constitutional:      Appearance: He is toxic-appearing.  Genitourinary:    Penis: Normal.      Comments: Diffuse uniform swelling, erythema of the scrotum.  There is blistering present on the left testicle. Neurological:     Mental Status: He is alert.     (all labs ordered are listed, but only abnormal results are displayed) Labs Reviewed  CBC WITH DIFFERENTIAL/PLATELET - Abnormal; Notable for the following components:      Result Value   WBC 19.9 (*)    Neutro Abs 17.2 (*)    Abs Immature Granulocytes 0.23 (*)    All other components within normal limits  COMPREHENSIVE METABOLIC PANEL WITH GFR - Abnormal; Notable for the following components:   Sodium 131 (*)    Glucose, Bld 317 (*)    Albumin 3.2 (*)    All other components within normal limits  CULTURE, BLOOD (ROUTINE X 2)  CULTURE, BLOOD (ROUTINE X 2)  I-STAT CG4 LACTIC ACID, ED  I-STAT CG4 LACTIC ACID, ED    EKG: None  Radiology: CT PELVIS W CONTRAST Result Date: 11/17/2023 CLINICAL DATA:  Evaluate for perianal abscess EXAM: CT PELVIS WITH CONTRAST TECHNIQUE: Multidetector CT imaging of the pelvis was performed using the standard protocol following  the bolus administration of intravenous contrast. RADIATION DOSE REDUCTION: This exam was performed according to the departmental dose-optimization program which includes automated exposure control, adjustment of the mA and/or kV according to patient size and/or use of iterative reconstruction technique. CONTRAST:  75mL OMNIPAQUE  IOHEXOL  350 MG/ML SOLN COMPARISON:  Ultrasound from earlier in the same day. FINDINGS: Urinary Tract: Bladder is well distended. Kidneys are not evaluated in this study. Bowel: No obstructive or inflammatory changes of the colon are seen. Scattered diverticular changes noted. Vascular/Lymphatic: Atherosclerotic  calcifications are noted without acute abnormality. Reproductive: Prostate is within normal limits. Cystic structure is again noted on the left likely representing an epididymal cyst or spermatocele stable from prior ultrasound. Other:  No free fluid is seen. Musculoskeletal: No acute bony abnormality is noted. No findings to suggest perianal abscess are seen. There are however significant changes in the left hemiscrotum and left perineum with edema and multifocal areas of air consistent with Fournier's gangrene. No discrete fluid collection is identified to suggest drainable abscess at this time. Again no perianal or perirectal involvement is noted. IMPRESSION: Changes consistent with Fournier's gangrene in the left perineum and left scrotum. Significant edematous changes are seen in the scrotal wall although no discrete fluid collection is noted. Cystic structure in the left hemiscrotum likely representing an epididymal cyst or spermatocele. This is stable from the prior ultrasound. Electronically Signed   By: Oneil Devonshire M.D.   On: 11/17/2023 21:05   US  SCROTUM W/DOPPLER Result Date: 11/17/2023 CLINICAL DATA:  10220 Abscess [id number], left testicular swelling and pain with fever EXAM: SCROTAL ULTRASOUND DOPPLER ULTRASOUND OF THE TESTICLES TECHNIQUE: Complete ultrasound examination of the testicles, epididymis, and other scrotal structures was performed. Color and spectral Doppler ultrasound were also utilized to evaluate blood flow to the testicles. COMPARISON:  February 03, 2019, June 15, 2018 FINDINGS: Right testicle Measurements: 5.1 x 2.2 x 4.3 cm. No mass or microlithiasis visualized. Left testicle Measurements: 5.5 x 3.1 x 2.4 cm. No mass or microlithiasis visualized. Right epididymis:  Normal in size and appearance. Left epididymis: Large, lobular cystic structure replacing the majority of the epididymal parenchyma, measuring 7.7 x 4.5 x 5.1 cm, and containing echogenic debris. Previously, this measured  5.9 x 3.6 x 4.9 cm. Hydrocele:  Trace right hydrocele. Varicocele:  None visualized. Pulsed Doppler interrogation of both testes demonstrates normal low resistance arterial and venous waveforms bilaterally. Scrotal wall thickening with subcutaneous edema. IMPRESSION: 1. Scrotal wall thickening with subcutaneous edema, which may represent changes of scrotal cellulitis or dependent edema. No well-formed abscess or drainable fluid collection visualized. 2. No testicular mass, findings of epididymo-orchitis, or changes of testicular torsion, at this time. 3. Large left epididymal cyst/spermatocele, measuring 7.7 x 4.5 x 5.1 cm (previously, 5.9 x 3.6 x 4.9 cm). Electronically Signed   By: Rogelia Myers M.D.   On: 11/17/2023 18:52     .Critical Care  Performed by: Mannie Fairy DASEN, DO Authorized by: Mannie Fairy DASEN, DO   Critical care provider statement:    Critical care time (minutes):  33   Critical care was necessary to treat or prevent imminent or life-threatening deterioration of the following conditions:  Sepsis   Critical care was time spent personally by me on the following activities:  Development of treatment plan with patient or surrogate, discussions with consultants, evaluation of patient's response to treatment, examination of patient, ordering and review of laboratory studies, ordering and review of radiographic studies, ordering and performing treatments and interventions, pulse oximetry, re-evaluation  of patient's condition and review of old charts    Medications Ordered in the ED  clindamycin  (CLEOCIN ) IVPB 300 mg (300 mg Intravenous New Bag/Given 11/17/23 2119)  vancomycin  (VANCOCIN ) 2,500 mg in sodium chloride  0.9 % 500 mL IVPB (has no administration in time range)  sodium chloride  0.9 % bolus 1,000 mL (has no administration in time range)  sodium chloride  0.9 % bolus 1,000 mL (has no administration in time range)  piperacillin -tazobactam (ZOSYN ) IVPB 3.375 g (0 g Intravenous  Stopped 11/17/23 2101)  morphine  (PF) 4 MG/ML injection 4 mg (4 mg Intravenous Given 11/17/23 2037)  ondansetron  (ZOFRAN ) injection 4 mg (4 mg Intravenous Given 11/17/23 2037)  iohexol  (OMNIPAQUE ) 350 MG/ML injection 75 mL (75 mLs Intravenous Contrast Given 11/17/23 2055)                                    Medical Decision Making 43 year old male here today with scrotal pain, swelling, fever.  Differential diagnoses include foreigners green, scrotal cellulitis.  Plan-initial concern this patient is Fournier's gangrene.  Reached out to urology and spoke with Dr. Carolee who requested CT of the pelvis of this patient which I have ordered.  Have ordered broad-spectrum antibiotics and the patient.  Reassessment 9:20 PM-my independent review of the patient's pelvis CT shows air in the gas which correlates with the clinical exam.  Spoke with Dr. Carolee who is coming in to take the patient to the OR.  Out of concern for fluid overload, provided the patient with 2 L of IV fluid.  This patient has sepsis without signs of septic shock.  Unable to reevaluate the patient following his full fluid bolus as he will go to the OR prior to completion of this bolus.  Amount and/or Complexity of Data Reviewed Radiology: ordered.  Risk Prescription drug management. Decision regarding hospitalization.        Final diagnoses:  Fournier gangrene    ED Discharge Orders     None          Mannie Fairy DASEN, DO 11/17/23 2123

## 2023-11-17 NOTE — ED Triage Notes (Signed)
 Pt arrives via POV. PT reports he has had an abscess to left side of his scrotum since last week. Reports temp of 103.8. Hx of HS.

## 2023-11-17 NOTE — Sepsis Progress Note (Signed)
 Elink monitoring for the code sepsis protocol.

## 2023-11-17 NOTE — Consult Note (Signed)
 [name] [birth year]  [id number].    Requesting MD: Dr. Sherwood Edison Chief Complaint/Reason for Consult: necrotizing soft tissue infection  HPI:  Edward Booth is a 43 yo male with a history of hidradenitis for about 6 years, who began having buttock pain for the last 5-6 days. He started having chills and rigors yesterday, with progressive severe pain in the scrotum. He saw his PCP and was started on Bactrim . Today he had rapidly progressive pain and swelling, extending into the groin. He presented to the ED. He was found to have a WBC of 19. A CT scan showed gas in the subcutaneous tissue of the right groin, with scrotal swelling, concerning for Fournier gangrene. Urology was consulted and Dr. Edison is taking to the OR. General surgery was also consulted given the groin involvement.  ROS: Review of Systems  Constitutional:  Positive for chills and fever.  Respiratory:  Negative for shortness of breath.   Genitourinary:        Scrotal/groin pain    Family History  Problem Relation Age of Onset   CAD Mother    Kidney failure Mother     Past Medical History:  Diagnosis Date   Dysuria    Family history of adverse reaction to anesthesia    mother-- ponv   Headache    Hematuria    Left ureteral stone    Nephrolithiasis    bilateral non-obstructive per CT 09-10-2017   Urgency of urination     Past Surgical History:  Procedure Laterality Date   CYSTOSCOPY W/ URETERAL STENT PLACEMENT Left 09/21/2017   Procedure: CYSTOSCOPY WITH RETROGRADE PYELOGRAM/URETERAL STENT PLACEMENT;  Surgeon: Edison Sherwood JONETTA DOUGLAS, MD;  Location: [facility];  Service: Urology;  Laterality: Left;   CYSTOSCOPY WITH RETROGRADE PYELOGRAM, URETEROSCOPY AND STENT PLACEMENT Left 10/07/2017   Procedure: CYSTOSCOPY WITH LEFT  RETROGRADE PYELOGRAM, URETEROSCOPY ,LITHROTRIPSY.  AND STENT REPLACEMENT;  Surgeon: Edison Sherwood JONETTA DOUGLAS, MD;  Location: [facility];  Service: Urology;  Laterality: Left;   HOLMIUM  LASER APPLICATION Left 10/07/2017   Procedure: HOLMIUM LASER APPLICATION;  Surgeon: Edison Sherwood JONETTA DOUGLAS, MD;  Location: [facility];  Service: Urology;  Laterality: Left;   INGUINAL HERNIA REPAIR Left 1986    Social History:  reports that he has been smoking cigarettes. He has a 20 pack-year smoking history. He has never used smokeless tobacco. He reports current alcohol use. He reports that he does not use drugs.  Allergies:  Allergies  Allergen Reactions   Aspartame And Phenylalanine Anaphylaxis    (Not in a hospital admission)    Physical Exam: Blood pressure (!) 138/101, pulse (!) 108, temperature 100.2 F (37.9 C), temperature source Oral, resp. rate 20, SpO2 100%. General: resting comfortably, appears stated age, no apparent distress Neurological: alert and oriented, no focal deficits HEENT: normocephalic, atraumatic Respiratory: normal work of breathing on room air, symmetric chest wall expansion Abdomen: soft, nondistended GU: marked scrotal swelling and erythema, with extension into the left groin, with exquisite tenderness to palpation. Psychiatric: normal mood and affect   Results for orders placed or performed during the hospital encounter of 11/17/23 (from the past 48 hours)  CBC with Differential     Status: Abnormal   Collection Time: 11/17/23  6:10 PM  Result Value Ref Range   WBC 19.9 (H) 4.0 - 10.5 K/uL   RBC 5.40 4.22 - 5.81 MIL/uL   Hemoglobin 17.0 13.0 - 17.0 g/dL   HCT 52.4 60.9 - 47.9 %  MCV 88.0 80.0 - 100.0 fL   MCH 31.5 26.0 - 34.0 pg   MCHC 35.8 30.0 - 36.0 g/dL   RDW 86.8 88.4 - 84.4 %   Platelets 235 150 - 400 K/uL   nRBC 0.0 0.0 - 0.2 %   Neutrophils Relative % 87 %   Neutro Abs 17.2 (H) 1.7 - 7.7 K/uL   Lymphocytes Relative 8 %   Lymphs Abs 1.5 0.7 - 4.0 K/uL   Monocytes Relative 4 %   Monocytes Absolute 0.9 0.1 - 1.0 K/uL   Eosinophils Relative 0 %   Eosinophils Absolute 0.1 0.0 - 0.5 K/uL   Basophils Relative 0 %    Basophils Absolute 0.1 0.0 - 0.1 K/uL   Immature Granulocytes 1 %   Abs Immature Granulocytes 0.23 (H) 0.00 - 0.07 K/uL    Comment: Performed at [facility], [address], [place], [place] [address]  Comprehensive metabolic panel     Status: Abnormal   Collection Time: 11/17/23  6:10 PM  Result Value Ref Range   Sodium 131 (L) 135 - 145 mmol/L   Potassium 3.8 3.5 - 5.1 mmol/L   Chloride 100 98 - 111 mmol/L   CO2 22 22 - 32 mmol/L   Glucose, Bld 317 (H) 70 - 99 mg/dL    Comment: Glucose reference range applies only to samples taken after fasting for at least 8 hours.   BUN 8 6 - 20 mg/dL   Creatinine, Ser 9.09 0.61 - 1.24 mg/dL   Calcium 9.1 8.9 - 89.6 mg/dL   Total Protein 6.9 6.5 - 8.1 g/dL   Albumin 3.2 (L) 3.5 - 5.0 g/dL   AST 16 15 - 41 U/L   ALT 17 0 - 44 U/L   Alkaline Phosphatase 87 38 - 126 U/L   Total Bilirubin 0.7 0.0 - 1.2 mg/dL   GFR, Estimated >39 >39 mL/min    Comment: (NOTE) Calculated using the CKD-EPI Creatinine Equation (2021)    Anion gap 9 5 - 15    Comment: Performed at [facility], [address], [place], [place] [address]  I-Stat CG4 Lactic Acid     Status: None   Collection Time: 11/17/23  6:21 PM  Result Value Ref Range   Lactic Acid, Venous 1.5 0.5 - 1.9 mmol/L  I-Stat CG4 Lactic Acid     Status: None   Collection Time: 11/17/23  8:30 PM  Result Value Ref Range   Lactic Acid, Venous 1.0 0.5 - 1.9 mmol/L   CT PELVIS W CONTRAST Result Date: 11/17/2023 CLINICAL DATA:  Evaluate for perianal abscess EXAM: CT PELVIS WITH CONTRAST TECHNIQUE: Multidetector CT imaging of the pelvis was performed using the standard protocol following the bolus administration of intravenous contrast. RADIATION DOSE REDUCTION: This exam was performed according to the departmental dose-optimization program which includes automated exposure control, adjustment of the mA and/or kV according to patient size and/or use of iterative reconstruction technique. CONTRAST:   75mL OMNIPAQUE  IOHEXOL  350 MG/ML SOLN COMPARISON:  Ultrasound from earlier in the same day. FINDINGS: Urinary Tract: Bladder is well distended. Kidneys are not evaluated in this study. Bowel: No obstructive or inflammatory changes of the colon are seen. Scattered diverticular changes noted. Vascular/Lymphatic: Atherosclerotic calcifications are noted without acute abnormality. Reproductive: Prostate is within normal limits. Cystic structure is again noted on the left likely representing an epididymal cyst or spermatocele stable from prior ultrasound. Other:  No free fluid is seen. Musculoskeletal: No acute bony abnormality  is noted. No findings to suggest perianal abscess are seen. There are however significant changes in the left hemiscrotum and left perineum with edema and multifocal areas of air consistent with Fournier's gangrene. No discrete fluid collection is identified to suggest drainable abscess at this time. Again no perianal or perirectal involvement is noted. IMPRESSION: Changes consistent with Fournier's gangrene in the left perineum and left scrotum. Significant edematous changes are seen in the scrotal wall although no discrete fluid collection is noted. Cystic structure in the left hemiscrotum likely representing an epididymal cyst or spermatocele. This is stable from the prior ultrasound. Electronically Signed   By: Oneil Devonshire M.D.   On: 11/17/2023 21:05   US  SCROTUM W/DOPPLER Result Date: 11/17/2023 CLINICAL DATA:  10220 Abscess [id number], left testicular swelling and pain with fever EXAM: SCROTAL ULTRASOUND DOPPLER ULTRASOUND OF THE TESTICLES TECHNIQUE: Complete ultrasound examination of the testicles, epididymis, and other scrotal structures was performed. Color and spectral Doppler ultrasound were also utilized to evaluate blood flow to the testicles. COMPARISON:  February 03, 2019, June 15, 2018 FINDINGS: Right testicle Measurements: 5.1 x 2.2 x 4.3 cm. No mass or microlithiasis visualized.  Left testicle Measurements: 5.5 x 3.1 x 2.4 cm. No mass or microlithiasis visualized. Right epididymis:  Normal in size and appearance. Left epididymis: Large, lobular cystic structure replacing the majority of the epididymal parenchyma, measuring 7.7 x 4.5 x 5.1 cm, and containing echogenic debris. Previously, this measured 5.9 x 3.6 x 4.9 cm. Hydrocele:  Trace right hydrocele. Varicocele:  None visualized. Pulsed Doppler interrogation of both testes demonstrates normal low resistance arterial and venous waveforms bilaterally. Scrotal wall thickening with subcutaneous edema. IMPRESSION: 1. Scrotal wall thickening with subcutaneous edema, which may represent changes of scrotal cellulitis or dependent edema. No well-formed abscess or drainable fluid collection visualized. 2. No testicular mass, findings of epididymo-orchitis, or changes of testicular torsion, at this time. 3. Large left epididymal cyst/spermatocele, measuring 7.7 x 4.5 x 5.1 cm (previously, 5.9 x 3.6 x 4.9 cm). Electronically Signed   By: Rogelia Myers M.D.   On: 11/17/2023 18:52      Assessment/Plan 43 yo male with necrotizing soft tissue infection involving the left groin and possibly the scrotum. Proceed for debridement with urology. I discussed the planned debridement with the patient, including the likelihood that he will have a large inguinal wound postoperatively. He expressed understanding and agrees to proceed. All questions were answered.   Leonor Dawn, MD [facility] Surgery General, Hepatobiliary and Pancreatic Surgery 11/17/23 10:54 PM

## 2023-11-18 ENCOUNTER — Encounter (HOSPITAL_COMMUNITY): Payer: Self-pay | Admitting: Urology

## 2023-11-18 ENCOUNTER — Other Ambulatory Visit: Payer: Self-pay

## 2023-11-18 DIAGNOSIS — R509 Fever, unspecified: Secondary | ICD-10-CM

## 2023-11-18 DIAGNOSIS — D72829 Elevated white blood cell count, unspecified: Secondary | ICD-10-CM

## 2023-11-18 DIAGNOSIS — N493 Fournier gangrene: Secondary | ICD-10-CM | POA: Diagnosis not present

## 2023-11-18 LAB — GLUCOSE, CAPILLARY
Glucose-Capillary: 257 mg/dL — ABNORMAL HIGH (ref 70–99)
Glucose-Capillary: 302 mg/dL — ABNORMAL HIGH (ref 70–99)
Glucose-Capillary: 268 mg/dL — ABNORMAL HIGH (ref 70–99)
Glucose-Capillary: 216 mg/dL — ABNORMAL HIGH (ref 70–99)
Glucose-Capillary: 234 mg/dL — ABNORMAL HIGH (ref 70–99)
Glucose-Capillary: 239 mg/dL — ABNORMAL HIGH (ref 70–99)

## 2023-11-18 LAB — HEMOGLOBIN A1C
Hgb A1c MFr Bld: 9.5 % — ABNORMAL HIGH (ref 4.8–5.6)
Mean Plasma Glucose: 225.95 mg/dL

## 2023-11-18 LAB — BASIC METABOLIC PANEL WITH GFR
Anion gap: 13 (ref 5–15)
BUN: 9 mg/dL (ref 6–20)
CO2: 21 mmol/L — ABNORMAL LOW (ref 22–32)
Calcium: 8.7 mg/dL — ABNORMAL LOW (ref 8.9–10.3)
Chloride: 101 mmol/L (ref 98–111)
Creatinine, Ser: 0.78 mg/dL (ref 0.61–1.24)
GFR, Estimated: >60 mL/min (ref >60)
Glucose, Bld: 282 mg/dL — ABNORMAL HIGH (ref 70–99)
Potassium: 4.8 mmol/L (ref 3.5–5.1)
Sodium: 135 mmol/L (ref 135–145)

## 2023-11-18 LAB — CBC
HCT: 43 % (ref 39.0–52.0)
Hemoglobin: 14.7 g/dL (ref 13.0–17.0)
MCH: 30.4 pg (ref 26.0–34.0)
MCHC: 34.2 g/dL (ref 30.0–36.0)
MCV: 89 fL (ref 80.0–100.0)
Platelets: 207 10*3/uL (ref 150–400)
RBC: 4.83 MIL/uL (ref 4.22–5.81)
RDW: 13.2 % (ref 11.5–15.5)
WBC: 22 10*3/uL — ABNORMAL HIGH (ref 4.0–10.5)
nRBC: 0 % (ref 0.0–0.2)

## 2023-11-18 LAB — MAGNESIUM: Magnesium: 1.9 mg/dL (ref 1.7–2.4)

## 2023-11-18 LAB — PHOSPHORUS: Phosphorus: 3.4 mg/dL (ref 2.5–4.6)

## 2023-11-18 LAB — HIV ANTIBODY (ROUTINE TESTING W REFLEX): HIV Screen 4th Generation wRfx: NONREACTIVE

## 2023-11-18 MED ORDER — ONDANSETRON HCL 4 MG/2ML IJ SOLN
INTRAMUSCULAR | Status: DC | PRN
  Administered 2023-11-18: 4 mg via INTRAVENOUS

## 2023-11-18 MED ORDER — ACETAMINOPHEN 325 MG PO TABS: 650.0000 mg | ORAL_TABLET | Freq: Four times a day (QID) | ORAL | Status: DC | PRN

## 2023-11-18 MED ORDER — PIPERACILLIN-TAZOBACTAM 3.375 G IVPB
3.3750 g | Freq: Three times a day (TID) | INTRAVENOUS | Status: DC
  Administered 2023-11-18: 3.375 g via INTRAVENOUS
  Filled 2023-11-18: qty 50

## 2023-11-18 MED ORDER — INSULIN GLARGINE-YFGN 100 UNIT/ML ~~LOC~~ SOLN
5.0000 [IU] | Freq: Two times a day (BID) | SUBCUTANEOUS | Status: DC
  Administered 2023-11-18: 5 [IU] via SUBCUTANEOUS
  Filled 2023-11-18 (×2): qty 0.05

## 2023-11-18 MED ORDER — OXYCODONE HCL 5 MG PO TABS
5.0000 mg | ORAL_TABLET | ORAL | Status: DC | PRN
  Administered 2023-11-18: 10 mg via ORAL
  Administered 2023-11-18: 5 mg via ORAL
  Administered 2023-11-18 – 2023-11-22 (×13): 10 mg via ORAL
  Filled 2023-11-18 (×12): qty 2
  Filled 2023-11-18: qty 1
  Filled 2023-11-18 (×2): qty 2

## 2023-11-18 MED ORDER — INSULIN ASPART 100 UNIT/ML IJ SOLN
0.0000 [IU] | Freq: Every day | INTRAMUSCULAR | Status: DC
  Administered 2023-11-18 – 2023-11-19 (×2): 2 [IU] via SUBCUTANEOUS
  Administered 2023-11-20: 3 [IU] via SUBCUTANEOUS

## 2023-11-18 MED ORDER — VANCOMYCIN HCL 1500 MG/300ML IV SOLN
1500.0000 mg | Freq: Two times a day (BID) | INTRAVENOUS | Status: DC
  Filled 2023-11-18: qty 300

## 2023-11-18 MED ORDER — SODIUM CHLORIDE 0.9 % IV SOLN
3.0000 g | Freq: Four times a day (QID) | INTRAVENOUS | Status: DC
  Administered 2023-11-18 – 2023-11-22 (×17): 3 g via INTRAVENOUS
  Filled 2023-11-18 (×17): qty 8

## 2023-11-18 MED ORDER — SODIUM CHLORIDE 0.9 % IV SOLN: INTRAVENOUS | Status: DC

## 2023-11-18 MED ORDER — INSULIN ASPART 100 UNIT/ML IJ SOLN
0.0000 [IU] | Freq: Three times a day (TID) | INTRAMUSCULAR | Status: DC
  Administered 2023-11-18 (×2): 7 [IU] via SUBCUTANEOUS
  Administered 2023-11-18: 11 [IU] via SUBCUTANEOUS
  Administered 2023-11-19 (×2): 7 [IU] via SUBCUTANEOUS
  Administered 2023-11-19 – 2023-11-20 (×3): 4 [IU] via SUBCUTANEOUS
  Administered 2023-11-20: 7 [IU] via SUBCUTANEOUS
  Administered 2023-11-21 – 2023-11-22 (×4): 4 [IU] via SUBCUTANEOUS
  Administered 2023-11-22: 7 [IU] via SUBCUTANEOUS

## 2023-11-18 MED ORDER — SUGAMMADEX SODIUM 200 MG/2ML IV SOLN
INTRAVENOUS | Status: DC | PRN
  Administered 2023-11-18: 250 mg via INTRAVENOUS

## 2023-11-18 MED ORDER — LIVING WELL WITH DIABETES BOOK
Freq: Once | Status: DC
  Filled 2023-11-18: qty 1

## 2023-11-18 MED ORDER — HYDROMORPHONE HCL 1 MG/ML IJ SOLN
0.5000 mg | INTRAMUSCULAR | Status: DC | PRN
  Administered 2023-11-18 – 2023-11-21 (×4): 0.5 mg via INTRAVENOUS
  Filled 2023-11-18 (×5): qty 0.5

## 2023-11-18 MED ORDER — INSULIN GLARGINE-YFGN 100 UNIT/ML ~~LOC~~ SOLN
22.0000 [IU] | Freq: Every day | SUBCUTANEOUS | Status: DC
  Filled 2023-11-18: qty 0.22

## 2023-11-18 MED ORDER — VANCOMYCIN HCL 1750 MG/350ML IV SOLN
1750.0000 mg | Freq: Two times a day (BID) | INTRAVENOUS | Status: DC
  Administered 2023-11-18 – 2023-11-20 (×5): 1750 mg via INTRAVENOUS
  Filled 2023-11-18 (×6): qty 350

## 2023-11-18 MED ORDER — POLYETHYLENE GLYCOL 3350 17 G PO PACK: 17.0000 g | PACK | Freq: Every day | ORAL | Status: DC | PRN

## 2023-11-18 MED ORDER — INSULIN ASPART 100 UNIT/ML IJ SOLN
0.0000 [IU] | INTRAMUSCULAR | Status: DC
  Administered 2023-11-18: 15 [IU] via SUBCUTANEOUS
  Administered 2023-11-18: 11 [IU] via SUBCUTANEOUS

## 2023-11-18 MED ORDER — MELATONIN 5 MG PO TABS: 5.0000 mg | ORAL_TABLET | Freq: Every evening | ORAL | Status: DC | PRN

## 2023-11-18 MED ORDER — CHLORHEXIDINE GLUCONATE CLOTH 2 % EX PADS
6.0000 | MEDICATED_PAD | Freq: Every day | CUTANEOUS | Status: DC
  Administered 2023-11-18 – 2023-11-19 (×2): 6 via TOPICAL

## 2023-11-18 MED ORDER — PROCHLORPERAZINE EDISYLATE 10 MG/2ML IJ SOLN: 5.0000 mg | Freq: Four times a day (QID) | INTRAMUSCULAR | Status: DC | PRN

## 2023-11-18 NOTE — H&P (Addendum)
 History and Physical  [name] FMW:[id number] DOB: [birth year] DOA: 11/17/2023  Referring physician: Dr. Mannie, EDP  PCP: Pcp, No  Outpatient Specialists: Urology Patient coming from: Home  Chief Complaint: Scrotal pain  HPI: Edward Booth is a 43 y.o. male with medical history significant for uncontrolled diabetes, obesity, hidradenitis suppurativa of anus (diagnosed on 11/16/2023, started on Bactrim ), who presents to the ER due to scrotal pain.  Associated with subjective high fevers at home 103.8.  In the ER, tachycardic and tachypneic.  Scrotum is edematous, erythematous, tender and warm.  Febrile with leukocytosis.  Peripheral blood cultures x 2 obtained.  Started on empiric IV antibiotics Zosyn  and IV vancomycin .  CT pelvis with contrast revealed changes consistent with Fournier's gangrene in the left perineum and left scrotum.  Cystic structure in the left hemiscrotum likely representing an epididymal cyst or spermatocele.  This is stable from the prior ultrasound.  EDP discussed the case with urology, Dr. Carolee.  The patient was taken to the OR urgently.  Admitted by [facility], hospitalist service.  ED Course: Temperature 100.2.  BP 125/78, pulse 109, respiration rate 25, O2 saturation 96% on room air.  Lab studies notable for WBC 19.9.  Neutrophil count 17.2.  Serum sodium 131, glucose 317.  Review of Systems: Review of systems as noted in the HPI. All other systems reviewed and are negative.   Past Medical History:  Diagnosis Date   Dysuria    Family history of adverse reaction to anesthesia    mother-- ponv   Headache    Hematuria    Left ureteral stone    Nephrolithiasis    bilateral non-obstructive per CT 09-10-2017   Urgency of urination    Past Surgical History:  Procedure Laterality Date   CYSTOSCOPY W/ URETERAL STENT PLACEMENT Left 09/21/2017   Procedure: CYSTOSCOPY WITH RETROGRADE PYELOGRAM/URETERAL STENT PLACEMENT;  Surgeon: Carolee Sherwood JONETTA DOUGLAS, MD;  Location:  [facility];  Service: Urology;  Laterality: Left;   CYSTOSCOPY WITH RETROGRADE PYELOGRAM, URETEROSCOPY AND STENT PLACEMENT Left 10/07/2017   Procedure: CYSTOSCOPY WITH LEFT  RETROGRADE PYELOGRAM, URETEROSCOPY ,LITHROTRIPSY.  AND STENT REPLACEMENT;  Surgeon: Carolee Sherwood JONETTA DOUGLAS, MD;  Location: [facility];  Service: Urology;  Laterality: Left;   HOLMIUM LASER APPLICATION Left 10/07/2017   Procedure: HOLMIUM LASER APPLICATION;  Surgeon: Carolee Sherwood JONETTA DOUGLAS, MD;  Location: [facility];  Service: Urology;  Laterality: Left;   INGUINAL HERNIA REPAIR Left 1986    Social History:  reports that he has been smoking cigarettes. He has a 20 pack-year smoking history. He has never used smokeless tobacco. He reports current alcohol use. He reports that he does not use drugs.   Allergies  Allergen Reactions   Aspartame And Phenylalanine Anaphylaxis    Family History  Problem Relation Age of Onset   CAD Mother    Kidney failure Mother       Prior to Admission medications   Medication Sig Start Date End Date Taking? Authorizing Provider  acetaminophen  (TYLENOL ) 325 MG tablet Take 2 tablets (650 mg total) by mouth every 6 (six) hours as needed for mild pain (or Fever >/= 101). 02/07/19   Pearlean Manus, MD  blood glucose meter kit and supplies Relion Prime or Dispense other brand based on patient and insurance preference. Use up to four times daily as directed. (FOR ICD-9 250.00, 250.01). 02/07/19   Pearlean Manus, MD  ibuprofen  (ADVIL ) 800 MG tablet Take 1 tablet (800 mg total) by mouth  every 8 (eight) hours as needed. 11/16/23   Vivienne Delon HERO, PA-C  meloxicam (MOBIC) 15 MG tablet Take 15 mg by mouth daily as needed. 11/16/23   [provider]  sulfamethoxazole -trimethoprim  (BACTRIM  DS) 800-160 MG tablet Take 1 tablet by mouth 2 (two) times daily. 11/16/23   Vivienne Delon HERO, PA-C    Physical Exam: BP 99/69 (BP Location: Left Arm)   Pulse 95   Temp 98.2 F  (36.8 C) (Oral)   Resp 18   Ht 6' (1.829 m)   Wt 125.2 kg   SpO2 96%   BMI 37.43 kg/m   General: 43 y.o. year-old male well developed well nourished in no acute distress.  Alert and oriented x3. Cardiovascular: Regular rate and rhythm with no rubs or gallops.  No thyromegaly or JVD noted.  No lower extremity edema. 2/4 pulses in all 4 extremities. Respiratory: Clear to auscultation with no wheezes or rales. Good inspiratory effort. Abdomen: Soft nontender nondistended with normal bowel sounds x4 quadrants. Muskuloskeletal: No cyanosis, clubbing or edema noted bilaterally Neuro: CN II-XII intact, strength, sensation, reflexes Skin: Scrotum is erythematous, tender, and edematous Psychiatry: Judgement and insight appear normal. Mood is appropriate for condition and setting          Labs on Admission:  Basic Metabolic Panel: Recent Labs  Lab 11/17/23 1810  NA 131*  K 3.8  CL 100  CO2 22  GLUCOSE 317*  BUN 8  CREATININE 0.90  CALCIUM 9.1   Liver Function Tests: Recent Labs  Lab 11/17/23 1810  AST 16  ALT 17  ALKPHOS 87  BILITOT 0.7  PROT 6.9  ALBUMIN 3.2*   No results for input(s): LIPASE, AMYLASE in the last 168 hours. No results for input(s): AMMONIA in the last 168 hours. CBC: Recent Labs  Lab 11/17/23 1810  WBC 19.9*  NEUTROABS 17.2*  HGB 17.0  HCT 47.5  MCV 88.0  PLT 235   Cardiac Enzymes: No results for input(s): CKTOTAL, CKMB, CKMBINDEX, TROPONINI in the last 168 hours.  BNP (last 3 results) No results for input(s): BNP in the last 8760 hours.  ProBNP (last 3 results) No results for input(s): PROBNP in the last 8760 hours.  CBG: Recent Labs  Lab 11/18/23 0135  GLUCAP 257*    Radiological Exams on Admission: CT PELVIS W CONTRAST Result Date: 11/17/2023 CLINICAL DATA:  Evaluate for perianal abscess EXAM: CT PELVIS WITH CONTRAST TECHNIQUE: Multidetector CT imaging of the pelvis was performed using the standard protocol  following the bolus administration of intravenous contrast. RADIATION DOSE REDUCTION: This exam was performed according to the departmental dose-optimization program which includes automated exposure control, adjustment of the mA and/or kV according to patient size and/or use of iterative reconstruction technique. CONTRAST:  75mL OMNIPAQUE  IOHEXOL  350 MG/ML SOLN COMPARISON:  Ultrasound from earlier in the same day. FINDINGS: Urinary Tract: Bladder is well distended. Kidneys are not evaluated in this study. Bowel: No obstructive or inflammatory changes of the colon are seen. Scattered diverticular changes noted. Vascular/Lymphatic: Atherosclerotic calcifications are noted without acute abnormality. Reproductive: Prostate is within normal limits. Cystic structure is again noted on the left likely representing an epididymal cyst or spermatocele stable from prior ultrasound. Other:  No free fluid is seen. Musculoskeletal: No acute bony abnormality is noted. No findings to suggest perianal abscess are seen. There are however significant changes in the left hemiscrotum and left perineum with edema and multifocal areas of air consistent with Fournier's gangrene. No discrete fluid collection is identified  to suggest drainable abscess at this time. Again no perianal or perirectal involvement is noted. IMPRESSION: Changes consistent with Fournier's gangrene in the left perineum and left scrotum. Significant edematous changes are seen in the scrotal wall although no discrete fluid collection is noted. Cystic structure in the left hemiscrotum likely representing an epididymal cyst or spermatocele. This is stable from the prior ultrasound. Electronically Signed   By: Oneil Devonshire M.D.   On: 11/17/2023 21:05   US  SCROTUM W/DOPPLER Result Date: 11/17/2023 CLINICAL DATA:  10220 Abscess [id number], left testicular swelling and pain with fever EXAM: SCROTAL ULTRASOUND DOPPLER ULTRASOUND OF THE TESTICLES TECHNIQUE: Complete ultrasound  examination of the testicles, epididymis, and other scrotal structures was performed. Color and spectral Doppler ultrasound were also utilized to evaluate blood flow to the testicles. COMPARISON:  February 03, 2019, June 15, 2018 FINDINGS: Right testicle Measurements: 5.1 x 2.2 x 4.3 cm. No mass or microlithiasis visualized. Left testicle Measurements: 5.5 x 3.1 x 2.4 cm. No mass or microlithiasis visualized. Right epididymis:  Normal in size and appearance. Left epididymis: Large, lobular cystic structure replacing the majority of the epididymal parenchyma, measuring 7.7 x 4.5 x 5.1 cm, and containing echogenic debris. Previously, this measured 5.9 x 3.6 x 4.9 cm. Hydrocele:  Trace right hydrocele. Varicocele:  None visualized. Pulsed Doppler interrogation of both testes demonstrates normal low resistance arterial and venous waveforms bilaterally. Scrotal wall thickening with subcutaneous edema. IMPRESSION: 1. Scrotal wall thickening with subcutaneous edema, which may represent changes of scrotal cellulitis or dependent edema. No well-formed abscess or drainable fluid collection visualized. 2. No testicular mass, findings of epididymo-orchitis, or changes of testicular torsion, at this time. 3. Large left epididymal cyst/spermatocele, measuring 7.7 x 4.5 x 5.1 cm (previously, 5.9 x 3.6 x 4.9 cm). Electronically Signed   By: Rogelia Myers M.D.   On: 11/17/2023 18:52    EKG: I independently viewed the EKG done and my findings are as followed: None available at time of this visit.  Assessment/Plan Present on Admission:  Fournier gangrene  Principal Problem:   Fournier gangrene  Fournier gangrene, POA Status post excisional debridement of groin on 11/18/2023 by urology Dr. Carolee Greatly appreciate urology's assistance. Continue IV antibiotics, Zosyn  and IV vancomycin  Continue gentle IV fluid hydration NS at 50 cc/h x 2 days Control blood sugars As needed analgesics Follow peripheral blood cultures  x 2 and MRSA screening test.  Type 2 diabetes with hyperglycemia Last hemoglobin A1c 7.3 in 2020 Repeat hemoglobin A1c Start Lantus  5 units twice daily and resistant insulin  sliding scale.  Obesity BMI 37 Recommend weight loss outpatient   Time: 75 minutes.   DVT prophylaxis: SCDs.  Code Status: Full code.  Family Communication: Spouse at bedside.  Disposition Plan: Admitted to telemetry surgical unit.  Consults called: Urology  Admission status: Inpatient status.   Status is: Inpatient The patient requires at least 2 midnights for further evaluation and treatment of present condition.   Terry LOISE Hurst MD [facility] Pager [phone]  If 7PM-7AM, please contact night-coverage [email or web address] Password [email or web address]  11/18/2023, 4:39 AM

## 2023-11-18 NOTE — Inpatient Diabetes Management (Addendum)
 Inpatient Diabetes Program Recommendations  AACE/ADA: New Consensus Statement on Inpatient Glycemic Control (2015)  Target Ranges:  Prepandial:   less than 140 mg/dL      Peak postprandial:   less than 180 mg/dL (1-2 hours)      Critically ill patients:  140 - 180 mg/dL   Lab Results  Component Value Date   GLUCAP 268 (H) 11/18/2023   HGBA1C 9.5 (H) 11/18/2023    Review of Glycemic Control  Latest Reference Range & Units 11/18/23 01:35 11/18/23 05:57 11/18/23 09:13  Glucose-Capillary 70 - 99 mg/dL 742 (H) 697 (H) 731 (H)   Diabetes history: DM -NEW DIAGNOSIS Outpatient Diabetes medications:  None Current orders for Inpatient glycemic control:  Novolog  0-20 units tid with meals and HS Semglee  5 units bid  Inpatient Diabetes Program Recommendations:    Note new diagnosis of DM.  A1C indicates average blood sugars of 225 mg/dL.  Will discuss with patient when appropriate.   Addendum 1400- Spoke to patient regarding DM management.  He states he was diagnosed several years ago and was started on metformin .  He states that he changed his diet and lost close to 100 pounds and that blood sugars improved.  He stopped taking the metformin  due to GI upset.  He states that he had no idea that blood sugars had gone back up.  His PCP has retired and he does not have one anymore.  Will need new PCP and possibly insulin  at discharge.  I explained this to him.  He states that his wife takes GLP-1 and that it has really helped her blood sugars. I explained that he may need insulin  at discharge initially if CBG's remain>180 mg/dL. Will order LWWD booklet for patient as well as follow for possible insulin  teaching.    -Consider increasing Semglee  to 22 units daily.    Thanks,  Randall Bullocks, RN, BC-ADM Inpatient Diabetes Coordinator Pager [phone]  (8a-5p)

## 2023-11-18 NOTE — Op Note (Signed)
 Operative Note  Preoperative diagnosis:  1.  Fournier's gangrene  Postoperative diagnosis: 1.  Fournier's gangrene  Procedure(s): 1.  Excisional debridement of groin  Surgeon: Sherwood Edison, MD  Co-surgeon: Leonor Dawn, MD, general surgery  Anesthesia: General  Complications: None immediate  EBL: 50 cc  Specimens: 1.  Wound culture 2.  Tissue specimen for wound culture  Drains/Catheters: 1.  Foley catheter  Intraoperative findings: Left-sided groin necrotic infection that was deep tissue.  Superficial tissue/skin was not involved  Indication: 43 year old male presented with Fournier's gangrene.  He presents for previously mentioned operation.  Description of procedure:  The patient was identified and consent was obtained.  The patient was taken to the operating room and placed in the supine position.  The patient was placed under general anesthesia.  Perioperative antibiotics were administered.  The patient was placed in dorsal lithotomy.  Patient was prepped and draped in a standard sterile fashion and a timeout was performed.  A left-sided groin incision was made approximately 5 cm longitudinally.  This was taken down to the lateral edge of the scrotum.  We came through the subcutaneous tissue and fat and a copious amount of pus was evacuated from the abscess cavity.  There was a large amount of necrotic tissue of the deep layer.  This was sharply debrided.  The abscess cavity extended inferiorly down to the buttock so we opened the incision down to the buttock wound and sharply debrided more deep necrotic tissue.  All necrotic tissue was debrided.  Some skin edges were debrided that would not be viable, approximately 3 cm x 7 cm on the right and 3 cm x 5 cm on the left.  The dartos appeared healthy and the infection did not extend into the scrotum.  The scrotum therefore was not opened.  Once all necrotic tissue was debrided, we obtained hemostasis with electrocautery and then  packed the wound with saline soaked Kerlix.  I placed a Foley catheter.  This concluded the operation.  Patient tolerated the procedure well and was stable postoperatively.  Plan: Continue IV antibiotics.  Possible takeback in a day or 2 for reassessment.

## 2023-11-18 NOTE — Plan of Care (Signed)

## 2023-11-18 NOTE — Progress Notes (Signed)
 Pharmacy Antibiotic Note  Edward Booth is a 43 y.o. 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  Pharmacy has been consulted for Vancomycin   dosing.  Plan: Vancomycin  1750 mg IV q12h  Height: 6' (182.9 cm) Weight: 125.2 kg (276 lb) IBW/kg (Calculated) : 77.6  Temp (24hrs), Avg:98.6 F (37 C), Min:98 F (36.7 C), Max:100.2 F (37.9 C)  Recent Labs  Lab 11/17/23 1810 11/17/23 1821 11/17/23 2030  WBC 19.9*  --   --   CREATININE 0.90  --   --   LATICACIDVEN  --  1.5 1.0    Estimated Creatinine Clearance: 141.6 mL/min (by C-G formula based on SCr of 0.9 mg/dL).    Allergies  Allergen Reactions   Aspartame And Phenylalanine Anaphylaxis    Dail Cordella Misty 11/18/2023 5:08 AM

## 2023-11-18 NOTE — Plan of Care (Signed)
  Problem: Education: Goal: Knowledge of General Education information will improve Description: Including pain rating scale, medication(s)/side effects and non-pharmacologic comfort measures Outcome: Progressing   Problem: Activity: Goal: Risk for activity intolerance will decrease Outcome: Progressing   Problem: Nutrition: Goal: Adequate nutrition will be maintained Outcome: Progressing   Problem: Pain Managment: Goal: General experience of comfort will improve and/or be controlled Outcome: Progressing   Problem: Safety: Goal: Ability to remain free from injury will improve Outcome: Progressing   Problem: Skin Integrity: Goal: Risk for impaired skin integrity will decrease Outcome: Progressing   Problem: Tissue Perfusion: Goal: Adequacy of tissue perfusion will improve Outcome: Progressing

## 2023-11-18 NOTE — Progress Notes (Signed)
 Pt has just arrived to unit, assessed, informed of POC, all questions answered at this time

## 2023-11-18 NOTE — TOC CM/SW Note (Signed)
 Transition of Care [facility]) - Inpatient Brief Assessment   Patient Details  Name: Edward Booth MRN: [id number] Date of Birth: [birth year]  Transition of Care [facility]) CM/SW Contact:    Andrez JULIANNA George, RN Phone Number: 11/18/2023, 1:21 PM   Clinical Narrative:  Necrotizing infection of the left groin extending onto the perineum and left medial gluteus. S/p debridement. On IV abx.  IP Care management following for dc needs.   Transition of Care Asessment: Insurance and Status: Insurance coverage has been reviewed Patient has primary care physician: Yes Home environment has been reviewed: home with spouse   Prior/Current Home Services: No current home services Social Drivers of Health Review: SDOH reviewed no interventions necessary Readmission risk has been reviewed: Yes Transition of care needs: transition of care needs identified, TOC will continue to follow

## 2023-11-18 NOTE — Op Note (Signed)
 Date: 11/18/23  Patient: Edward Booth MRN: [id number]  Preoperative Diagnosis: Necrotizing soft tissue infection of left groin and left gluteus Postoperative Diagnosis: Same  Procedure: Excisional debridement of left groin and gluteus  Surgeon: Leonor Dawn, MD Co-Surgeon: Sherwood Edison, MD  EBL: 50 mL  Anesthesia: General  Specimens:  Wound culture Tissue for culture  Indications: Mr. Hinderman is a 43 yo male with a history of gluteal hidradenitis. He noticed a new gluteal abscess about a week ago, and in the last few days has developed progressive pain extending onto the scrotum and left groin, with fevers and chills. He presented to the ED today, and a CT scan showed extensive subcutaneous emphysema in the left groin concerning for a necrotizing infection. The patient was brought to the operating room emergently.  Findings: Necrotizing infection of the left groin extending onto the perineum and left medial gluteus.   Procedure details: Informed consent was obtained in the preoperative area prior to the procedure. The patient was brought to the operating room, general anesthesia was induced and appropriate lines and drains were placed for intraoperative monitoring. Perioperative antibiotics were administered per SCIP guidelines. The patient was placed in lithotomy and the groin and perineum were prepped and draped in the usual sterile fashion. A pre-procedure timeout was taken verifying patient identity, surgical site and procedure to be performed.  A vertical skin incision was made in the left groin and extended towards the base of the scrotum.  The subcutaneous tissue was divided with cautery.  A large pocket of turbid fluid was encountered and drained.  The wound cavity was swabbed for cultures.  On probing the wound, it tracked anteriorly into the left groin and posteriorly towards the left gluteus.  The incision was further extended posteriorly toward the left gluteus.  The  subcutaneous tissue in the left groin was sharply debrided.  Necrotic tissue adjacent to the base of the scrotum was also sharply debrided, along with an overlying nonviable piece of skin that was approximately 3 x 4 cm in size.  The wound continued to track posteriorly onto the left gluteus.  The wound opening was extended onto the gluteus using cautery.  This was still well away from the anal verge in the rectum.  There was necrotic tissue in the perineum and left gluteus, which was sharply debrided.  The wound was examined and some additional necrotic tissue was sharply debrided from the left groin.  Some nonviable tissue in the superior left groin was also excised and measured approximately 3 x 4 cm.  Once all nonviable tissue had been debrided back to healthy bleeding tissue, the wound was irrigated with saline and appeared hemostatic.  The wound was packed with saline moistened Kerlix.  The patient tolerated the procedure well with no apparent complications. The patient was extubated and taken to PACU in stable condition.  Leonor Dawn, MD 11/18/23 4:13 AM

## 2023-11-18 NOTE — Progress Notes (Signed)
 Brief same day note:  Patient is a 43 year old male with history of untreated diabetes type 2, obesity presenting to the ED with complaints of scrotal pain.  Report of high-grade fever at home.  On presentation, he was tachycardic, labs showed leukocytosis.  Scrotum was edematous, erythematous, tender.  Cultures were taken.  Started on empiric antibiotics.  CT pelvis showed changes consistent with Fournier's gangrene in the left perineum and left scrotum.  Urology/general surgery consulted.  Underwent excisional debridement of the groin and left gluteus.  Patient seen and examined at bedside this morning.  He looks comfortable.  Denies significant pain on the scrotum.  He has a Foley catheter.  Assessment  and plan:  Fournier's gangrene: Presented with high-grade fever, scrotal pain.  Leukocytosis was present on admission.  Started on broad-spectrum antibiotics.  CT imaging as above.  General surgery, urology consulted.  Underwent excisional debridement of the left groin and gluteus. Continue broad-spectrum antibiotics.  Follow-up cultures.  ID consulted.   Type 2 diabetes: New A1c of 9.5.  Does not take any medications at home.  Continue insulin  regimen.  Blood sugars will be monitored.  Diabetic coordinator  consulted   Obesity: BMI of 37.

## 2023-11-18 NOTE — Consult Note (Signed)
 [facility]    Date of Admission:  11/17/2023   Total days of inpatient antibiotics 2        Reason for Consult: Fournier's gangrene    Principal Problem:   Fournier gangrene   Assessment: 43 year old male with past medical history of poorly controlled diabetes, obesity, reported history of hidradenitis suppurativa, Bactrim  on 7/14 for concern for hidradenitis suppurative of anus presented to ED with scrotal pain found to have #Fournier's gangrene status post I&D - Patient states he has had hidradenitis suppurativa lesions in his groin for few years.  He has gone doxycycline  in the past which he tolerated.  He says he has not seen dermatology for it.  Episode of I&D was about 2 years ago. -He presented with temp 100.2, leukocytosis.  CT was concerning for pulmonary stranding - Urology engaged taken to the OR for debridement, cultures obtained #Poorly diabetes - A1c 9.5 - This is a risk factor for infection. Glycemic control for optimal. Recommendations:  -Follow-up blood cultures - Follow or cultures - DC pip-tazo, start Unasyn  - Continue vancomycin  - Discussed that he needs to see dermatology outpatient established diagnosis of hydranitis - Standard precautions  Evaluation of this patient requires complex antimicrobial therapy evaluation and counseling + isolation needs for disease transmission risk assessment and mitigation   Microbiology:   Antibiotics: 7/15-present vancomycin  pip-tazo  Cultures: Blood 9/15 Urine  Other   HPI: Edward Booth is a 43 y.o. male with past medical history of uncontrolled diabetes, obesity, reported history of hidradenitis suppurativa, given Bactrim  on 7/14 by PCP for suspected hidradenitis suppurative of anus presented to the ED with scrotal pain.  Patient had fever at home, 103.8.  CT showed changes consistent Fournier's gangrene in the left perineum and left scrotum.  No discrete fluid collection  noted.  Urology engaged and patient taken to the OR for debridement.  IntraOp findings showed left sided groin necrotic infection that was deep tissue.  Section did not appear to extend into the scrotum.  ID engaged for antibiotic recommendations.   Review of Systems: Review of Systems  All other systems reviewed and are negative.   Past Medical History:  Diagnosis Date   Dysuria    Family history of adverse reaction to anesthesia    mother-- ponv   Headache    Hematuria    Left ureteral stone    Nephrolithiasis    bilateral non-obstructive per CT 09-10-2017   Urgency of urination     Social History   Tobacco Use   Smoking status: Every Day    Current packs/day: 1.00    Average packs/day: 1 pack/day for 20.0 years (20.0 ttl pk-yrs)    Types: Cigarettes   Smokeless tobacco: Never  Vaping Use   Vaping status: Never Used  Substance Use Topics   Alcohol use: Yes    Comment: occasionally   Drug use: Never    Family History  Problem Relation Age of Onset   CAD Mother    Kidney failure Mother    Scheduled Meds:  Chlorhexidine  Gluconate Cloth  6 each Topical Q0600   insulin  aspart  0-20 Units Subcutaneous TID WC   insulin  aspart  0-5 Units Subcutaneous QHS   insulin  glargine-yfgn  5 Units Subcutaneous BID   Continuous Infusions:  sodium chloride  50 mL/hr at 11/18/23 0606   ampicillin -sulbactam (UNASYN ) IV 3 g (11/18/23 1352)   sodium chloride      vancomycin  1,750 mg (  11/18/23 1136)   PRN Meds:.acetaminophen , HYDROmorphone  (DILAUDID ) injection, melatonin, oxyCODONE , polyethylene glycol, prochlorperazine  Allergies  Allergen Reactions   Aspartame And Phenylalanine Anaphylaxis    OBJECTIVE: Blood pressure (!) 132/94, pulse 87, temperature 98.2 F (36.8 C), resp. rate 18, height 6' (1.829 m), weight 125.2 kg, SpO2 98%.  Physical Exam Constitutional:      General: He is not in acute distress.    Appearance: He is normal weight. He is not toxic-appearing.  HENT:      Head: Normocephalic and atraumatic.     Right Ear: External ear normal.     Left Ear: External ear normal.     Nose: No congestion or rhinorrhea.     Mouth/Throat:     Mouth: Mucous membranes are moist.     Pharynx: Oropharynx is clear.  Eyes:     Extraocular Movements: Extraocular movements intact.     Conjunctiva/sclera: Conjunctivae normal.     Pupils: Pupils are equal, round, and reactive to light.  Cardiovascular:     Rate and Rhythm: Normal rate and regular rhythm.     Heart sounds: No murmur heard.    No friction rub. No gallop.  Pulmonary:     Effort: Pulmonary effort is normal.     Breath sounds: Normal breath sounds.  Abdominal:     General: Abdomen is flat. Bowel sounds are normal.     Palpations: Abdomen is soft.  Genitourinary:    Comments: Groin wound bandaged Musculoskeletal:        General: No swelling. Normal range of motion.     Cervical back: Normal range of motion and neck supple.  Skin:    General: Skin is warm and dry.  Neurological:     General: No focal deficit present.     Mental Status: He is oriented to person, place, and time.  Psychiatric:        Mood and Affect: Mood normal.     Lab Results Lab Results  Component Value Date   WBC 22.0 (H) 11/18/2023   HGB 14.7 11/18/2023   HCT 43.0 11/18/2023   MCV 89.0 11/18/2023   PLT 207 11/18/2023    Lab Results  Component Value Date   CREATININE 0.78 11/18/2023   BUN 9 11/18/2023   NA 135 11/18/2023   K 4.8 11/18/2023   CL 101 11/18/2023   CO2 21 (L) 11/18/2023    Lab Results  Component Value Date   ALT 17 11/17/2023   AST 16 11/17/2023   ALKPHOS 87 11/17/2023   BILITOT 0.7 11/17/2023       Loney Stank, MD [facility] 11/18/2023, 1:59 PM

## 2023-11-18 NOTE — Transfer of Care (Signed)
 Immediate Anesthesia Transfer of Care Note  Patient: Edward Booth  Procedure(s) Performed: EXPLORATION, SCROTUM  Patient Location: PACU  Anesthesia Type:General  Level of Consciousness: awake and alert   Airway & Oxygen Therapy: Patient Spontanous Breathing and Patient connected to face mask oxygen  Post-op Assessment: Report given to RN and Post -op Vital signs reviewed and stable  Post vital signs: Reviewed and stable  Last Vitals:  Vitals Value Taken Time  BP 125/70 11/18/23 00:50  Temp    Pulse 107 11/18/23 00:52  Resp 24 11/18/23 00:52  SpO2 94 % 11/18/23 00:52  Vitals shown include unfiled device data.  Last Pain:  Vitals:   11/17/23 2119  TempSrc:   PainSc: 5          Complications: No notable events documented.

## 2023-11-18 NOTE — Progress Notes (Signed)
 Progress Note  1 Day Post-Op  Subjective: Pt reports some fear around mobilizing just not being sure what all tissue was involved. Pain well managed while still. Reports his wife was an EMT for many years and she is who would need to help with dressing changes.   Objective: Vital signs in last 24 hours: Temp:  [97.8 F (36.6 C)-100.2 F (37.9 C)] 98.2 F (36.8 C) (07/16 0912) Pulse Rate:  [87-116] 87 (07/16 0912) Resp:  [18-26] 18 (07/16 0912) BP: (99-165)/(69-101) 132/94 (07/16 0912) SpO2:  [92 %-100 %] 98 % (07/16 0912) Weight:  [125.2 kg] 125.2 kg (07/15 2238) Last BM Date : 11/17/23 (pta)  Intake/Output from previous day: 07/15 0701 - 07/16 0700 In: 2106.8 [P.O.:480; I.V.:1500; IV Piggyback:126.8] Out: 2050 [Urine:2000; Blood:50] Intake/Output this shift: Total I/O In: 240 [P.O.:240] Out: -   PE: General: pleasant, WD, overweight male who is laying in bed in NAD Heart: regular, rate, and rhythm.   Lungs:Respiratory effort nonlabored Abd: soft, NT, ND GU: left inguinal wound involves left lateral scrotum but testicle not exposed, wound extends back along left gluteal tissue, no significant purulent drainage and tissue overall looks fairly clean; foley present with straw colored urine Psych: A&Ox3 with an appropriate affect.    Lab Results:  Recent Labs    11/17/23 1810 11/18/23 0536  WBC 19.9* 22.0*  HGB 17.0 14.7  HCT 47.5 43.0  PLT 235 207   BMET Recent Labs    11/17/23 1810 11/18/23 0536  NA 131* 135  K 3.8 4.8  CL 100 101  CO2 22 21*  GLUCOSE 317* 282*  BUN 8 9  CREATININE 0.90 0.78  CALCIUM 9.1 8.7*   PT/INR No results for input(s): LABPROT, INR in the last 72 hours. CMP     Component Value Date/Time   NA 135 11/18/2023 0536   K 4.8 11/18/2023 0536   CL 101 11/18/2023 0536   CO2 21 (L) 11/18/2023 0536   GLUCOSE 282 (H) 11/18/2023 0536   BUN 9 11/18/2023 0536   CREATININE 0.78 11/18/2023 0536   CALCIUM 8.7 (L) 11/18/2023 0536    PROT 6.9 11/17/2023 1810   ALBUMIN 3.2 (L) 11/17/2023 1810   AST 16 11/17/2023 1810   ALT 17 11/17/2023 1810   ALKPHOS 87 11/17/2023 1810   BILITOT 0.7 11/17/2023 1810   GFRNONAA >60 11/18/2023 0536   GFRAA >60 02/07/2019 0436   Lipase     Component Value Date/Time   LIPASE 34 09/10/2017 2110       Studies/Results: CT PELVIS W CONTRAST Result Date: 11/17/2023 CLINICAL DATA:  Evaluate for perianal abscess EXAM: CT PELVIS WITH CONTRAST TECHNIQUE: Multidetector CT imaging of the pelvis was performed using the standard protocol following the bolus administration of intravenous contrast. RADIATION DOSE REDUCTION: This exam was performed according to the departmental dose-optimization program which includes automated exposure control, adjustment of the mA and/or kV according to patient size and/or use of iterative reconstruction technique. CONTRAST:  75mL OMNIPAQUE  IOHEXOL  350 MG/ML SOLN COMPARISON:  Ultrasound from earlier in the same day. FINDINGS: Urinary Tract: Bladder is well distended. Kidneys are not evaluated in this study. Bowel: No obstructive or inflammatory changes of the colon are seen. Scattered diverticular changes noted. Vascular/Lymphatic: Atherosclerotic calcifications are noted without acute abnormality. Reproductive: Prostate is within normal limits. Cystic structure is again noted on the left likely representing an epididymal cyst or spermatocele stable from prior ultrasound. Other:  No free fluid is seen. Musculoskeletal: No acute bony abnormality is  noted. No findings to suggest perianal abscess are seen. There are however significant changes in the left hemiscrotum and left perineum with edema and multifocal areas of air consistent with Fournier's gangrene. No discrete fluid collection is identified to suggest drainable abscess at this time. Again no perianal or perirectal involvement is noted. IMPRESSION: Changes consistent with Fournier's gangrene in the left perineum and left  scrotum. Significant edematous changes are seen in the scrotal wall although no discrete fluid collection is noted. Cystic structure in the left hemiscrotum likely representing an epididymal cyst or spermatocele. This is stable from the prior ultrasound. Electronically Signed   By: Oneil Devonshire M.D.   On: 11/17/2023 21:05   US  SCROTUM W/DOPPLER Result Date: 11/17/2023 CLINICAL DATA:  10220 Abscess [id number], left testicular swelling and pain with fever EXAM: SCROTAL ULTRASOUND DOPPLER ULTRASOUND OF THE TESTICLES TECHNIQUE: Complete ultrasound examination of the testicles, epididymis, and other scrotal structures was performed. Color and spectral Doppler ultrasound were also utilized to evaluate blood flow to the testicles. COMPARISON:  February 03, 2019, June 15, 2018 FINDINGS: Right testicle Measurements: 5.1 x 2.2 x 4.3 cm. No mass or microlithiasis visualized. Left testicle Measurements: 5.5 x 3.1 x 2.4 cm. No mass or microlithiasis visualized. Right epididymis:  Normal in size and appearance. Left epididymis: Large, lobular cystic structure replacing the majority of the epididymal parenchyma, measuring 7.7 x 4.5 x 5.1 cm, and containing echogenic debris. Previously, this measured 5.9 x 3.6 x 4.9 cm. Hydrocele:  Trace right hydrocele. Varicocele:  None visualized. Pulsed Doppler interrogation of both testes demonstrates normal low resistance arterial and venous waveforms bilaterally. Scrotal wall thickening with subcutaneous edema. IMPRESSION: 1. Scrotal wall thickening with subcutaneous edema, which may represent changes of scrotal cellulitis or dependent edema. No well-formed abscess or drainable fluid collection visualized. 2. No testicular mass, findings of epididymo-orchitis, or changes of testicular torsion, at this time. 3. Large left epididymal cyst/spermatocele, measuring 7.7 x 4.5 x 5.1 cm (previously, 5.9 x 3.6 x 4.9 cm). Electronically Signed   By: Rogelia Myers M.D.   On: 11/17/2023 18:52     Anti-infectives: Anti-infectives (From admission, onward)    Start     Dose/Rate Route Frequency Ordered Stop   11/18/23 1200  Ampicillin -Sulbactam (UNASYN ) 3 g in sodium chloride  0.9 % 100 mL IVPB        3 g 200 mL/hr over 30 Minutes Intravenous Every 6 hours 11/18/23 1040     11/18/23 1000  vancomycin  (VANCOREADY) IVPB 1500 mg/300 mL  Status:  Discontinued        1,500 mg 150 mL/hr over 120 Minutes Intravenous 2 times daily 11/18/23 0502 11/18/23 0509   11/18/23 1000  vancomycin  (VANCOREADY) IVPB 1750 mg/350 mL        1,750 mg 175 mL/hr over 120 Minutes Intravenous 2 times daily 11/18/23 0509     11/18/23 0400  piperacillin -tazobactam (ZOSYN ) IVPB 3.375 g  Status:  Discontinued        3.375 g 12.5 mL/hr over 240 Minutes Intravenous Every 8 hours 11/18/23 0224 11/18/23 1040   11/17/23 2015  vancomycin  (VANCOREADY) IVPB 2000 mg/400 mL  Status:  Discontinued        2,000 mg 200 mL/hr over 120 Minutes Intravenous  Once 11/17/23 2008 11/17/23 2009   11/17/23 2015  vancomycin  (VANCOCIN ) 2,500 mg in sodium chloride  0.9 % 500 mL IVPB        2,500 mg 262.5 mL/hr over 120 Minutes Intravenous  Once 11/17/23 2009 11/18/23 0007  11/17/23 2000  piperacillin -tazobactam (ZOSYN ) IVPB 3.375 g        3.375 g 100 mL/hr over 30 Minutes Intravenous  Once 11/17/23 1957 11/17/23 2101   11/17/23 2000  clindamycin  (CLEOCIN ) IVPB 300 mg        300 mg 100 mL/hr over 30 Minutes Intravenous  Once 11/17/23 1957 11/17/23 2149        Assessment/Plan  NSTI of left groin/perineum S/P I&D by Dr. Dasie and Dr. Carolee 7/15-7/16 - dressing changed at bedside today and pt tolerated well - do not think this needs to go back to OR tomorrow  - would plan daily dressing change with WTD or PRN if soiled with BM - continue foley today - ok to mobilize - continue IV abx  FEN: : CM/HH diet  VTE: ok to have SQH or LMWH from surgery standpoint ID: [email or web address]   - per [facility] -  Hidradenitis suppurativa T2DM,  uncontrolled    LOS: 1 day     Burnard JONELLE Louder, [facility] 11/18/2023, 11:15 AM Please see Amion for pager number during day hours 7:00am-4:30pm

## 2023-11-19 DIAGNOSIS — N493 Fournier gangrene: Secondary | ICD-10-CM | POA: Diagnosis not present

## 2023-11-19 LAB — CBC
HCT: 39.2 % (ref 39.0–52.0)
Hemoglobin: 13.7 g/dL (ref 13.0–17.0)
MCH: 31.2 pg (ref 26.0–34.0)
MCHC: 34.9 g/dL (ref 30.0–36.0)
MCV: 89.3 fL (ref 80.0–100.0)
Platelets: 235 K/uL (ref 150–400)
RBC: 4.39 MIL/uL (ref 4.22–5.81)
RDW: 13.2 % (ref 11.5–15.5)
WBC: 16.2 K/uL — ABNORMAL HIGH (ref 4.0–10.5)
nRBC: 0 % (ref 0.0–0.2)

## 2023-11-19 LAB — GLUCOSE, CAPILLARY
Glucose-Capillary: 197 mg/dL — ABNORMAL HIGH (ref 70–99)
Glucose-Capillary: 220 mg/dL — ABNORMAL HIGH (ref 70–99)
Glucose-Capillary: 205 mg/dL — ABNORMAL HIGH (ref 70–99)
Glucose-Capillary: 228 mg/dL — ABNORMAL HIGH (ref 70–99)

## 2023-11-19 LAB — BASIC METABOLIC PANEL WITH GFR
Anion gap: 12 (ref 5–15)
BUN: 13 mg/dL (ref 6–20)
CO2: 18 mmol/L — ABNORMAL LOW (ref 22–32)
Calcium: 8.3 mg/dL — ABNORMAL LOW (ref 8.9–10.3)
Chloride: 102 mmol/L (ref 98–111)
Creatinine, Ser: 0.83 mg/dL (ref 0.61–1.24)
GFR, Estimated: >60 mL/min
Glucose, Bld: 216 mg/dL — ABNORMAL HIGH (ref 70–99)
Potassium: 3.6 mmol/L (ref 3.5–5.1)
Sodium: 132 mmol/L — ABNORMAL LOW (ref 135–145)

## 2023-11-19 MED ORDER — INSULIN GLARGINE-YFGN 100 UNIT/ML ~~LOC~~ SOLN
25.0000 [IU] | Freq: Every day | SUBCUTANEOUS | Status: DC
  Administered 2023-11-19 – 2023-11-22 (×4): 25 [IU] via SUBCUTANEOUS
  Filled 2023-11-19 (×4): qty 0.25

## 2023-11-19 MED ORDER — ENSURE MAX PROTEIN PO LIQD
11.0000 [oz_av] | Freq: Every day | ORAL | Status: DC
  Administered 2023-11-19 – 2023-11-22 (×4): 11 [oz_av] via ORAL
  Filled 2023-11-19 (×4): qty 330

## 2023-11-19 MED ORDER — ALUM & MAG HYDROXIDE-SIMETH 200-200-20 MG/5ML PO SUSP
15.0000 mL | Freq: Four times a day (QID) | ORAL | Status: DC | PRN
Start: 2023-11-19 — End: 2023-11-22
  Administered 2023-11-19: 15 mL via ORAL
  Filled 2023-11-19: qty 30

## 2023-11-19 MED ORDER — INSULIN ASPART 100 UNIT/ML IJ SOLN
3.0000 [IU] | Freq: Three times a day (TID) | INTRAMUSCULAR | Status: DC
  Administered 2023-11-19 – 2023-11-22 (×9): 3 [IU] via SUBCUTANEOUS

## 2023-11-19 MED ORDER — ENOXAPARIN SODIUM 60 MG/0.6ML IJ SOSY
60.0000 mg | PREFILLED_SYRINGE | INTRAMUSCULAR | Status: DC
  Administered 2023-11-19 – 2023-11-21 (×3): 60 mg via SUBCUTANEOUS
  Filled 2023-11-19 (×3): qty 0.6

## 2023-11-19 MED ORDER — ADULT MULTIVITAMIN W/MINERALS CH
1.0000 | ORAL_TABLET | Freq: Every day | ORAL | Status: DC
  Administered 2023-11-19 – 2023-11-22 (×4): 1 via ORAL
  Filled 2023-11-19 (×4): qty 1

## 2023-11-19 NOTE — Anesthesia Postprocedure Evaluation (Signed)
 Anesthesia Post Note  Patient: Edward Booth  Procedure(s) Performed: EXPLORATION, SCROTUM     Patient location during evaluation: PACU Anesthesia Type: General Level of consciousness: awake and alert Pain management: pain level controlled Vital Signs Assessment: post-procedure vital signs reviewed and stable Respiratory status: spontaneous breathing, nonlabored ventilation, respiratory function stable and patient connected to nasal cannula oxygen Cardiovascular status: blood pressure returned to baseline and stable Postop Assessment: no apparent nausea or vomiting Anesthetic complications: no   No notable events documented.  Last Vitals:  Vitals:   11/19/23 0446 11/19/23 0926  BP: 130/84 133/88  Pulse: 85 81  Resp: 18 18  Temp: 36.7 C 36.7 C  SpO2: 97% 95%    Last Pain:  Vitals:   11/19/23 1018  TempSrc:   PainSc: 7                  Alonnie Bieker S

## 2023-11-19 NOTE — Progress Notes (Signed)
  RD consulted for nutrition education regarding uncontrolled diabetes.    Lab Results  Component Value Date   HGBA1C 9.5 (H) 11/18/2023   Pt reports he had trouble with his sugars 5-6 years ago and followed a keto diet that was better able to control his blood sugars. Pt has not been checking his blood sugars recently and stopped following a keto diet. Pt reports he only eats 1 meal per day. Has recently started drinking soda again. Has an allergy to Aspartame with reported anaphylaxis reaction. S/p NSTI of left groin/perineum S/P I&D by Dr. Dasie and Dr. Carolee 7/15-7/16  RD provided Carbohydrate Counting for People with Diabetes and Plate Method for Diabetes handout from the Academy of [employer]. Discussed different food groups and their effects on blood sugar, emphasizing carbohydrate-containing foods. Provided list of carbohydrates and recommended serving sizes of common foods.  Discussed importance of controlled and consistent carbohydrate intake throughout the day. Provided examples of ways to balance meals/snacks and encouraged intake of high-fiber, whole grain complex carbohydrates. Teach back method used.  Dietary recall: Breakfast: Nothing Lunch: Nothing Dinner: BBQ with mashed potatoes or pasta Drinks: 20 oz red bull, water, sweet tea, 4 12 oz Mountain Dews per day   Expect good compliance.  Body mass index is 37.43 kg/m. Pt meets criteria for Obese based on current BMI.  Current diet order is Heart Healthy/CHO modified, patient is consuming approximately 100% of meals at this time. Labs and medications reviewed. RD will add Ensure Max BID and MVI for wound healing.   No further nutrition interventions warranted at this time. RD contact information provided. If additional nutrition issues arise, please re-consult RD.  Edward Booth, RD Registered Dietitian  See Amion for more information

## 2023-11-19 NOTE — Plan of Care (Signed)
  Problem: Education: Goal: Knowledge of General Education information will improve Description: Including pain rating scale, medication(s)/side effects and non-pharmacologic comfort measures 11/19/2023 0805 by Lang Alois DEL, LPN Outcome: Progressing 11/19/2023 0804 by Lang Alois H, LPN Outcome: Progressing 11/19/2023 0804 by Lang Alois DEL, LPN Outcome: Progressing   Problem: Health Behavior/Discharge Planning: Goal: Ability to manage health-related needs will improve 11/19/2023 0805 by Lang Alois DEL, LPN Outcome: Progressing 11/19/2023 0804 by Lang Alois H, LPN Outcome: Progressing   Problem: Clinical Measurements: Goal: Ability to maintain clinical measurements within normal limits will improve Outcome: Progressing   Problem: Activity: Goal: Risk for activity intolerance will decrease 11/19/2023 0805 by Lang Alois H, LPN Outcome: Progressing 11/19/2023 0804 by Lang Alois H, LPN Outcome: Progressing 11/19/2023 0804 by Lang Alois H, LPN Outcome: Progressing   Problem: Nutrition: Goal: Adequate nutrition will be maintained 11/19/2023 0805 by Lang Alois H, LPN Outcome: Progressing 11/19/2023 0804 by Lang Alois H, LPN Outcome: Progressing 11/19/2023 0804 by Lang Alois H, LPN Outcome: Progressing   Problem: Coping: Goal: Level of anxiety will decrease 11/19/2023 0805 by Lang Alois DEL, LPN Outcome: Progressing 11/19/2023 0804 by Lang Alois H, LPN Outcome: Progressing 11/19/2023 0804 by Lang Alois H, LPN Outcome: Progressing   Problem: Elimination: Goal: Will not experience complications related to bowel motility 11/19/2023 0805 by Lang Alois DEL, LPN Outcome: Progressing 11/19/2023 0804 by Lang Alois H, LPN Outcome: Progressing 11/19/2023 0804 by Lang Alois H, LPN Outcome: Progressing   Problem: Pain Managment: Goal: General experience of comfort will improve and/or be  controlled Outcome: Progressing

## 2023-11-19 NOTE — Plan of Care (Signed)
  Problem: Education: Goal: Knowledge of General Education information will improve Description: Including pain rating scale, medication(s)/side effects and non-pharmacologic comfort measures 11/19/2023 0804 by Lang Alois DEL, LPN Outcome: Progressing 11/19/2023 0804 by Lang Alois DEL, LPN Outcome: Progressing   Problem: Health Behavior/Discharge Planning: Goal: Ability to manage health-related needs will improve Outcome: Progressing   Problem: Activity: Goal: Risk for activity intolerance will decrease 11/19/2023 0804 by Lang Alois H, LPN Outcome: Progressing 11/19/2023 0804 by Lang Alois H, LPN Outcome: Progressing   Problem: Nutrition: Goal: Adequate nutrition will be maintained 11/19/2023 0804 by Lang Alois H, LPN Outcome: Progressing 11/19/2023 0804 by Lang Alois H, LPN Outcome: Progressing   Problem: Coping: Goal: Level of anxiety will decrease 11/19/2023 0804 by Lang Alois DEL, LPN Outcome: Progressing 11/19/2023 0804 by Lang Alois H, LPN Outcome: Progressing   Problem: Elimination: Goal: Will not experience complications related to bowel motility 11/19/2023 0804 by Lang Alois DEL, LPN Outcome: Progressing 11/19/2023 0804 by Lang Alois H, LPN Outcome: Progressing   Problem: Pain Managment: Goal: General experience of comfort will improve and/or be controlled Outcome: Progressing

## 2023-11-19 NOTE — Progress Notes (Signed)
 Progress Note  2 Days Post-Op  Subjective: Wife at bedside and observed dressing change. Patient was given IV pain meds prior to change - will coordinate with RN tomorrow. My intent was for patient to be given PO meds prior to change.   Afebrile, HR 80's. normotentive WBC 16 from 22 Objective: Vital signs in last 24 hours: Temp:  [98 F (36.7 C)-98.2 F (36.8 C)] 98 F (36.7 C) (07/17 0926) Pulse Rate:  [81-93] 81 (07/17 0926) Resp:  [18-19] 18 (07/17 0926) BP: (103-133)/(46-88) 133/88 (07/17 0926) SpO2:  [95 %-100 %] 95 % (07/17 0926) Last BM Date : 11/17/23  Intake/Output from previous day: 07/16 0701 - 07/17 0700 In: 3057.7 [P.O.:1440; I.V.:617.7; IV Piggyback:1000] Out: 3100 [Urine:3100] Intake/Output this shift: No intake/output data recorded.  PE: General: pleasant, WD, overweight male who is laying in bed in NAD Heart: regular, rate, and rhythm.   Lungs:Respiratory effort nonlabored Abd: soft, NT, ND GU: left inguinal wound as pictured below with no purulent drainage; foley present with straw colored urine  Psych: A&Ox3 with an appropriate affect.    Lab Results:  Recent Labs    11/18/23 0536 11/19/23 0638  WBC 22.0* 16.2*  HGB 14.7 13.7  HCT 43.0 39.2  PLT 207 235   BMET Recent Labs    11/18/23 0536 11/19/23 0638  NA 135 132*  K 4.8 3.6  CL 101 102  CO2 21* 18*  GLUCOSE 282* 216*  BUN 9 13  CREATININE 0.78 0.83  CALCIUM 8.7* 8.3*   PT/INR No results for input(s): LABPROT, INR in the last 72 hours. CMP     Component Value Date/Time   NA 132 (L) 11/19/2023 0638   K 3.6 11/19/2023 0638   CL 102 11/19/2023 0638   CO2 18 (L) 11/19/2023 0638   GLUCOSE 216 (H) 11/19/2023 0638   BUN 13 11/19/2023 0638   CREATININE 0.83 11/19/2023 0638   CALCIUM 8.3 (L) 11/19/2023 0638   PROT 6.9 11/17/2023 1810   ALBUMIN 3.2 (L) 11/17/2023 1810   AST 16 11/17/2023 1810   ALT 17 11/17/2023 1810   ALKPHOS 87 11/17/2023 1810   BILITOT 0.7 11/17/2023  1810   GFRNONAA >60 11/19/2023 0638   GFRAA >60 02/07/2019 0436   Lipase     Component Value Date/Time   LIPASE 34 09/10/2017 2110       Studies/Results: CT PELVIS W CONTRAST Result Date: 11/17/2023 CLINICAL DATA:  Evaluate for perianal abscess EXAM: CT PELVIS WITH CONTRAST TECHNIQUE: Multidetector CT imaging of the pelvis was performed using the standard protocol following the bolus administration of intravenous contrast. RADIATION DOSE REDUCTION: This exam was performed according to the departmental dose-optimization program which includes automated exposure control, adjustment of the mA and/or kV according to patient size and/or use of iterative reconstruction technique. CONTRAST:  75mL OMNIPAQUE  IOHEXOL  350 MG/ML SOLN COMPARISON:  Ultrasound from earlier in the same day. FINDINGS: Urinary Tract: Bladder is well distended. Kidneys are not evaluated in this study. Bowel: No obstructive or inflammatory changes of the colon are seen. Scattered diverticular changes noted. Vascular/Lymphatic: Atherosclerotic calcifications are noted without acute abnormality. Reproductive: Prostate is within normal limits. Cystic structure is again noted on the left likely representing an epididymal cyst or spermatocele stable from prior ultrasound. Other:  No free fluid is seen. Musculoskeletal: No acute bony abnormality is noted. No findings to suggest perianal abscess are seen. There are however significant changes in the left hemiscrotum and left perineum with edema and multifocal areas of  air consistent with Fournier's gangrene. No discrete fluid collection is identified to suggest drainable abscess at this time. Again no perianal or perirectal involvement is noted. IMPRESSION: Changes consistent with Fournier's gangrene in the left perineum and left scrotum. Significant edematous changes are seen in the scrotal wall although no discrete fluid collection is noted. Cystic structure in the left hemiscrotum likely  representing an epididymal cyst or spermatocele. This is stable from the prior ultrasound. Electronically Signed   By: Oneil Devonshire M.D.   On: 11/17/2023 21:05   US  SCROTUM W/DOPPLER Result Date: 11/17/2023 CLINICAL DATA:  10220 Abscess [id number], left testicular swelling and pain with fever EXAM: SCROTAL ULTRASOUND DOPPLER ULTRASOUND OF THE TESTICLES TECHNIQUE: Complete ultrasound examination of the testicles, epididymis, and other scrotal structures was performed. Color and spectral Doppler ultrasound were also utilized to evaluate blood flow to the testicles. COMPARISON:  February 03, 2019, June 15, 2018 FINDINGS: Right testicle Measurements: 5.1 x 2.2 x 4.3 cm. No mass or microlithiasis visualized. Left testicle Measurements: 5.5 x 3.1 x 2.4 cm. No mass or microlithiasis visualized. Right epididymis:  Normal in size and appearance. Left epididymis: Large, lobular cystic structure replacing the majority of the epididymal parenchyma, measuring 7.7 x 4.5 x 5.1 cm, and containing echogenic debris. Previously, this measured 5.9 x 3.6 x 4.9 cm. Hydrocele:  Trace right hydrocele. Varicocele:  None visualized. Pulsed Doppler interrogation of both testes demonstrates normal low resistance arterial and venous waveforms bilaterally. Scrotal wall thickening with subcutaneous edema. IMPRESSION: 1. Scrotal wall thickening with subcutaneous edema, which may represent changes of scrotal cellulitis or dependent edema. No well-formed abscess or drainable fluid collection visualized. 2. No testicular mass, findings of epididymo-orchitis, or changes of testicular torsion, at this time. 3. Large left epididymal cyst/spermatocele, measuring 7.7 x 4.5 x 5.1 cm (previously, 5.9 x 3.6 x 4.9 cm). Electronically Signed   By: Rogelia Myers M.D.   On: 11/17/2023 18:52    Anti-infectives: Anti-infectives (From admission, onward)    Start     Dose/Rate Route Frequency Ordered Stop   11/18/23 1200  Ampicillin -Sulbactam (UNASYN ) 3 g  in sodium chloride  0.9 % 100 mL IVPB        3 g 200 mL/hr over 30 Minutes Intravenous Every 6 hours 11/18/23 1040     11/18/23 1000  vancomycin  (VANCOREADY) IVPB 1500 mg/300 mL  Status:  Discontinued        1,500 mg 150 mL/hr over 120 Minutes Intravenous 2 times daily 11/18/23 0502 11/18/23 0509   11/18/23 1000  vancomycin  (VANCOREADY) IVPB 1750 mg/350 mL        1,750 mg 175 mL/hr over 120 Minutes Intravenous 2 times daily 11/18/23 0509     11/18/23 0400  piperacillin -tazobactam (ZOSYN ) IVPB 3.375 g  Status:  Discontinued        3.375 g 12.5 mL/hr over 240 Minutes Intravenous Every 8 hours 11/18/23 0224 11/18/23 1040   11/17/23 2015  vancomycin  (VANCOREADY) IVPB 2000 mg/400 mL  Status:  Discontinued        2,000 mg 200 mL/hr over 120 Minutes Intravenous  Once 11/17/23 2008 11/17/23 2009   11/17/23 2015  vancomycin  (VANCOCIN ) 2,500 mg in sodium chloride  0.9 % 500 mL IVPB        2,500 mg 262.5 mL/hr over 120 Minutes Intravenous  Once 11/17/23 2009 11/18/23 0007   11/17/23 2000  piperacillin -tazobactam (ZOSYN ) IVPB 3.375 g        3.375 g 100 mL/hr over 30 Minutes Intravenous  Once 11/17/23 1957  11/17/23 2101   11/17/23 2000  clindamycin  (CLEOCIN ) IVPB 300 mg        300 mg 100 mL/hr over 30 Minutes Intravenous  Once 11/17/23 1957 11/17/23 2149        Assessment/Plan  NSTI of left groin/perineum S/P I&D by Dr. Dasie and Dr. Carolee 7/15-7/16 - afebrile. WBC down-trending - dressing changed at bedside today with wife observing - will place orders for [facility] RN but patient and wife understand that they will need to be able to change dressing as well - do not think this needs to go back to OR  - would plan daily dressing change with WTD or PRN if soiled with BM - will discuss with urology if foley can be removed - ok to mobilize - continue IV abx  FEN: : CM/HH diet  VTE: ok to have SQH or LMWH from surgery standpoint ID: [email or web address] ; Cx 7/15 w/ GPC, GNR - reincubated   - per [facility] -   Hidradenitis suppurativa T2DM, uncontrolled    LOS: 2 days     Burnard JONELLE Louder, [facility] Surgery 11/19/2023, 11:47 AM Please see Amion for pager number during day hours 7:00am-4:30pm

## 2023-11-19 NOTE — Discharge Instructions (Signed)
 WOUND CARE: - dressing to be changed daily - supplies: sterile saline, kerlix/guaze, scissors, ABD pads, tape  - remove dressing and all packing carefully, moistening with sterile saline as needed to avoid packing/internal dressing sticking to the wound. - clean edges of skin around the wound with water/gauze, making sure there is no tape debris or leakage left on skin that could cause skin irritation or breakdown. - dampen clean kerlix/gauze with sterile saline and pack wound from wound base to skin level, making sure to take note of any possible areas of wound tracking, tunneling and packing appropriately. Wound can be packed loosely. Trim kerlix/gauze to size if a whole roll/piece is not required. - cover wound with a dry ABD pad and secure with tape or briefs.  - change dressing as needed if leakage occurs, wound gets contaminated, or patient requests to shower. - patient may shower daily with wound open and following the shower the wound should be dried and a clean dressing placed.

## 2023-11-19 NOTE — Progress Notes (Signed)
 PROGRESS NOTE  Edward Booth  FMW:[id number] DOB: 05-28-1980 DOA: 11/17/2023 PCP: Pcp, No   Brief Narrative: Patient is a 43 year old male with history of untreated diabetes type 2, obesity presenting to the ED with complaints of scrotal pain.  Report of high-grade fever at home.  On presentation, he was tachycardic, labs showed leukocytosis.  Scrotum was edematous, erythematous, tender.  Cultures were taken.  Started on empiric antibiotics.  CT pelvis showed changes consistent with Fournier's gangrene in the left perineum and left scrotum.  Urology/general surgery consulted.  Underwent excisional debridement of the groin and left gluteus on 7/16.  Wound culture showing mixed organisms.  Currently on broad spectrum  antibiotic.  ID following.  Assessment & Plan:  Principal Problem:   Fournier gangrene   Fournier's gangrene: Presented with high-grade fever, scrotal pain.  Leukocytosis was present on admission.  Started on broad-spectrum antibiotics.  CT imaging as above.  General surgery, urology consulted.  Underwent excisional debridement of the left groin and gluteus. Continue broad-spectrum antibiotics for now.  ID following.  Wound culture showing mixed organisms.  Blood cultures no growth to date.  He is currently afebrile, hemodynamically stable.  Pain well-controlled.  Plan to continue Foley for today.  Type 2 diabetes: New A1c of 9.5.  Does not take any medications at home.  Continue insulin  regimen.  Blood sugars will be monitored.  Diabetic coordinator  consulted.  Might need insulin  on discharge.   Obesity: BMI of 37.        DVT prophylaxis:SCDs Start: 11/18/23 0224     Code Status: Full Code  Family Communication: None at the bedside  Patient status: Inpatient  Patient is from : Home  Anticipated discharge to: Home  Estimated DC date: 1 to 2 days, needs general surgery/urology clearance, finalization of culture report   Consultants: ID, general surgery,  urology  Procedures: Excisional debridement  Antimicrobials:  Anti-infectives (From admission, onward)    Start     Dose/Rate Route Frequency Ordered Stop   11/18/23 1200  Ampicillin -Sulbactam (UNASYN ) 3 g in sodium chloride  0.9 % 100 mL IVPB        3 g 200 mL/hr over 30 Minutes Intravenous Every 6 hours 11/18/23 1040     11/18/23 1000  vancomycin  (VANCOREADY) IVPB 1500 mg/300 mL  Status:  Discontinued        1,500 mg 150 mL/hr over 120 Minutes Intravenous 2 times daily 11/18/23 0502 11/18/23 0509   11/18/23 1000  vancomycin  (VANCOREADY) IVPB 1750 mg/350 mL        1,750 mg 175 mL/hr over 120 Minutes Intravenous 2 times daily 11/18/23 0509     11/18/23 0400  piperacillin -tazobactam (ZOSYN ) IVPB 3.375 g  Status:  Discontinued        3.375 g 12.5 mL/hr over 240 Minutes Intravenous Every 8 hours 11/18/23 0224 11/18/23 1040   11/17/23 2015  vancomycin  (VANCOREADY) IVPB 2000 mg/400 mL  Status:  Discontinued        2,000 mg 200 mL/hr over 120 Minutes Intravenous  Once 11/17/23 2008 11/17/23 2009   11/17/23 2015  vancomycin  (VANCOCIN ) 2,500 mg in sodium chloride  0.9 % 500 mL IVPB        2,500 mg 262.5 mL/hr over 120 Minutes Intravenous  Once 11/17/23 2009 11/18/23 0007   11/17/23 2000  piperacillin -tazobactam (ZOSYN ) IVPB 3.375 g        3.375 g 100 mL/hr over 30 Minutes Intravenous  Once 11/17/23 1957 11/17/23 2101   11/17/23 2000  clindamycin  (  CLEOCIN ) IVPB 300 mg        300 mg 100 mL/hr over 30 Minutes Intravenous  Once 11/17/23 1957 11/17/23 2149       Subjective: Patient seen and examined the bedside today.  Hemodynamically stable.  Lying on bed.  Complains of some gaseous distention.  Had a bowel movement yesterday.  Denies significant pain on the scrotum.  Objective: Vitals:   11/18/23 1624 11/18/23 1956 11/19/23 0446 11/19/23 0926  BP: 112/77 (!) 103/46 130/84 133/88  Pulse: 91 93 85 81  Resp: 19 18 18 18   Temp: 98.1 F (36.7 C) 98.2 F (36.8 C) 98 F (36.7 C) 98 F  (36.7 C)  TempSrc:  Oral Oral Oral  SpO2: 97% 98% 97% 95%  Weight:      Height:        Intake/Output Summary (Last 24 hours) at 11/19/2023 1041 Last data filed at 11/19/2023 0447 Gross per 24 hour  Intake 2817.7 ml  Output 3100 ml  Net -282.3 ml   Filed Weights   11/17/23 2238  Weight: 125.2 kg    Examination:  General exam: Overall comfortable, not in distress HEENT: PERRL Respiratory system:  no wheezes or crackles  Cardiovascular system: S1 & S2 heard, RRR.  Gastrointestinal system: Abdomen is nondistended, soft and nontender. Central nervous system: Alert and oriented Extremities: No edema, no clubbing ,no cyanosis Skin: No rashes, no ulcers,no icterus   GU: Foley catheter, dressing on the scrotum   Data Reviewed: I have personally reviewed following labs and imaging studies  CBC: Recent Labs  Lab 11/17/23 1810 11/18/23 0536 11/19/23 0638  WBC 19.9* 22.0* 16.2*  NEUTROABS 17.2*  --   --   HGB 17.0 14.7 13.7  HCT 47.5 43.0 39.2  MCV 88.0 89.0 89.3  PLT 235 207 235   Basic Metabolic Panel: Recent Labs  Lab 11/17/23 1810 11/18/23 0536 11/19/23 0638  NA 131* 135 132*  K 3.8 4.8 3.6  CL 100 101 102  CO2 22 21* 18*  GLUCOSE 317* 282* 216*  BUN 8 9 13   CREATININE 0.90 0.78 0.83  CALCIUM 9.1 8.7* 8.3*  MG  --  1.9  --   PHOS  --  3.4  --      Recent Results (from the past 240 hours)  Blood culture (routine x 2)     Status: None (Preliminary result)   Collection Time: 11/17/23  7:58 PM   Specimen: BLOOD  Result Value Ref Range Status   Specimen Description BLOOD SITE NOT SPECIFIED  Final   Special Requests   Final    BOTTLES DRAWN AEROBIC AND ANAEROBIC Blood Culture adequate volume   Culture   Final    NO GROWTH 2 DAYS Performed at [facility], [address], [place], [place] [address]    Report Status PENDING  Incomplete  Blood culture (routine x 2)     Status: None (Preliminary result)   Collection Time: 11/17/23  8:03 PM    Specimen: BLOOD  Result Value Ref Range Status   Specimen Description BLOOD SITE NOT SPECIFIED  Final   Special Requests   Final    BOTTLES DRAWN AEROBIC AND ANAEROBIC Blood Culture adequate volume   Culture   Final    NO GROWTH 2 DAYS Performed at [facility], [address], [place], [place] [address]    Report Status PENDING  Incomplete  Aerobic/Anaerobic Culture w Gram Stain (surgical/deep wound)     Status: None (Preliminary  result)   Collection Time: 11/17/23 11:35 PM   Specimen: Wound  Result Value Ref Range Status   Specimen Description WOUND  Final   Special Requests A  Final   Gram Stain   Final    FEW WBC PRESENT,BOTH PMN AND MONONUCLEAR ABUNDANT GRAM NEGATIVE RODS MODERATE GRAM POSITIVE COCCI IN PAIRS    Culture   Final    NO GROWTH 1 DAY Performed at [facility], [address] [place], [place] [address]    Report Status PENDING  Incomplete  Aerobic/Anaerobic Culture w Gram Stain (surgical/deep wound)     Status: None (Preliminary result)   Collection Time: 11/17/23 11:39 PM   Specimen: Wound  Result Value Ref Range Status   Specimen Description WOUND  Final   Special Requests B  Final   Gram Stain   Final    NO WBC SEEN FEW GRAM POSITIVE COCCI IN CLUSTERS FEW GRAM POSITIVE RODS    Culture   Final    CULTURE REINCUBATED FOR BETTER GROWTH Performed at [facility], [address] [place], [place] [address]    Report Status PENDING  Incomplete     Radiology Studies: CT PELVIS W CONTRAST Result Date: 11/17/2023 CLINICAL DATA:  Evaluate for perianal abscess EXAM: CT PELVIS WITH CONTRAST TECHNIQUE: Multidetector CT imaging of the pelvis was performed using the standard protocol following the bolus administration of intravenous contrast. RADIATION DOSE REDUCTION: This exam was performed according to the departmental dose-optimization program which includes automated exposure control, adjustment of the mA and/or kV according to patient size  and/or use of iterative reconstruction technique. CONTRAST:  75mL OMNIPAQUE  IOHEXOL  350 MG/ML SOLN COMPARISON:  Ultrasound from earlier in the same day. FINDINGS: Urinary Tract: Bladder is well distended. Kidneys are not evaluated in this study. Bowel: No obstructive or inflammatory changes of the colon are seen. Scattered diverticular changes noted. Vascular/Lymphatic: Atherosclerotic calcifications are noted without acute abnormality. Reproductive: Prostate is within normal limits. Cystic structure is again noted on the left likely representing an epididymal cyst or spermatocele stable from prior ultrasound. Other:  No free fluid is seen. Musculoskeletal: No acute bony abnormality is noted. No findings to suggest perianal abscess are seen. There are however significant changes in the left hemiscrotum and left perineum with edema and multifocal areas of air consistent with Fournier's gangrene. No discrete fluid collection is identified to suggest drainable abscess at this time. Again no perianal or perirectal involvement is noted. IMPRESSION: Changes consistent with Fournier's gangrene in the left perineum and left scrotum. Significant edematous changes are seen in the scrotal wall although no discrete fluid collection is noted. Cystic structure in the left hemiscrotum likely representing an epididymal cyst or spermatocele. This is stable from the prior ultrasound. Electronically Signed   By: Oneil Devonshire M.D.   On: 11/17/2023 21:05   US  SCROTUM W/DOPPLER Result Date: 11/17/2023 CLINICAL DATA:  10220 Abscess [id number], left testicular swelling and pain with fever EXAM: SCROTAL ULTRASOUND DOPPLER ULTRASOUND OF THE TESTICLES TECHNIQUE: Complete ultrasound examination of the testicles, epididymis, and other scrotal structures was performed. Color and spectral Doppler ultrasound were also utilized to evaluate blood flow to the testicles. COMPARISON:  February 03, 2019, June 15, 2018 FINDINGS: Right testicle  Measurements: 5.1 x 2.2 x 4.3 cm. No mass or microlithiasis visualized. Left testicle Measurements: 5.5 x 3.1 x 2.4 cm. No mass or microlithiasis visualized. Right epididymis:  Normal in size and appearance. Left epididymis: Large, lobular cystic structure replacing the majority  of the epididymal parenchyma, measuring 7.7 x 4.5 x 5.1 cm, and containing echogenic debris. Previously, this measured 5.9 x 3.6 x 4.9 cm. Hydrocele:  Trace right hydrocele. Varicocele:  None visualized. Pulsed Doppler interrogation of both testes demonstrates normal low resistance arterial and venous waveforms bilaterally. Scrotal wall thickening with subcutaneous edema. IMPRESSION: 1. Scrotal wall thickening with subcutaneous edema, which may represent changes of scrotal cellulitis or dependent edema. No well-formed abscess or drainable fluid collection visualized. 2. No testicular mass, findings of epididymo-orchitis, or changes of testicular torsion, at this time. 3. Large left epididymal cyst/spermatocele, measuring 7.7 x 4.5 x 5.1 cm (previously, 5.9 x 3.6 x 4.9 cm). Electronically Signed   By: Rogelia Myers M.D.   On: 11/17/2023 18:52    Scheduled Meds:  Chlorhexidine  Gluconate Cloth  6 each Topical Q0600   insulin  aspart  0-20 Units Subcutaneous TID WC   insulin  aspart  0-5 Units Subcutaneous QHS   insulin  glargine-yfgn  22 Units Subcutaneous Daily   living well with diabetes book   Does not apply Once   Continuous Infusions:  ampicillin -sulbactam (UNASYN ) IV 3 g (11/19/23 0550)   sodium chloride      vancomycin  1,750 mg (11/19/23 1013)     LOS: 2 days   Ivonne Mustache, MD [facility] P7/17/2025, 10:41 AM

## 2023-11-19 NOTE — Inpatient Diabetes Management (Signed)
 Inpatient Diabetes Program Recommendations  AACE/ADA: New Consensus Statement on Inpatient Glycemic Control (2015)  Target Ranges:  Prepandial:   less than 140 mg/dL      Peak postprandial:   less than 180 mg/dL (1-2 hours)      Critically ill patients:  140 - 180 mg/dL   Lab Results  Component Value Date   GLUCAP 220 (H) 11/19/2023   HGBA1C 9.5 (H) 11/18/2023    Review of Glycemic Control  Latest Reference Range & Units 11/18/23 16:36 11/18/23 19:53 11/19/23 09:17 11/19/23 12:48  Glucose-Capillary 70 - 99 mg/dL 765 (H) 760 (H) 802 (H) 220 (H)   Diabetes history: DM 2 Outpatient Diabetes medications: None Current orders for Inpatient glycemic control:  Novolog  0-20 units tid with meals and HS Semglee  22 units daily  Inpatient Diabetes Program Recommendations:    May consider increasing Semglee  to 25 units daily and add Novolog  3 units tid with meals (hold if patient eats less than 50% or NPO).   Thanks,  Randall Bullocks, RN, BC-ADM Inpatient Diabetes Coordinator Pager [phone]  (8a-5p)

## 2023-11-19 NOTE — Plan of Care (Signed)

## 2023-11-20 ENCOUNTER — Other Ambulatory Visit (HOSPITAL_COMMUNITY): Payer: Self-pay

## 2023-11-20 ENCOUNTER — Telehealth (HOSPITAL_COMMUNITY): Payer: Self-pay | Admitting: Pharmacy Technician

## 2023-11-20 DIAGNOSIS — N493 Fournier gangrene: Secondary | ICD-10-CM | POA: Diagnosis not present

## 2023-11-20 LAB — CBC
HCT: 41.1 % (ref 39.0–52.0)
Hemoglobin: 14.4 g/dL (ref 13.0–17.0)
MCH: 31 pg (ref 26.0–34.0)
MCHC: 35 g/dL (ref 30.0–36.0)
MCV: 88.4 fL (ref 80.0–100.0)
Platelets: 286 K/uL (ref 150–400)
RBC: 4.65 MIL/uL (ref 4.22–5.81)
RDW: 13 % (ref 11.5–15.5)
WBC: 12.1 K/uL — ABNORMAL HIGH (ref 4.0–10.5)
nRBC: 0 % (ref 0.0–0.2)

## 2023-11-20 LAB — AEROBIC/ANAEROBIC CULTURE W GRAM STAIN (SURGICAL/DEEP WOUND): Gram Stain: NONE SEEN

## 2023-11-20 LAB — GLUCOSE, CAPILLARY
Glucose-Capillary: 174 mg/dL — ABNORMAL HIGH (ref 70–99)
Glucose-Capillary: 182 mg/dL — ABNORMAL HIGH (ref 70–99)
Glucose-Capillary: 231 mg/dL — ABNORMAL HIGH (ref 70–99)
Glucose-Capillary: 270 mg/dL — ABNORMAL HIGH (ref 70–99)

## 2023-11-20 LAB — VANCOMYCIN, TROUGH: Vancomycin Tr: 9 ug/mL — ABNORMAL LOW (ref 15–20)

## 2023-11-20 LAB — VANCOMYCIN, PEAK: Vancomycin Pk: 22 ug/mL — ABNORMAL LOW (ref 30–40)

## 2023-11-20 MED ORDER — VANCOMYCIN HCL 2000 MG/400ML IV SOLN
2000.0000 mg | Freq: Two times a day (BID) | INTRAVENOUS | Status: DC
  Filled 2023-11-20: qty 400

## 2023-11-20 NOTE — Progress Notes (Addendum)
 Pharmacy Antibiotic Note  Edward Booth is a 43 y.o. 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  Pharmacy has been consulted for Vancomycin   dosing.  Vanc trough was 9 drawn at 9:12 Vanc peak was 22 drawn at 13:01  Calculated AUC was 395  Goal AUC 450  Plan: Increase Vancomycin  2000 mg IV q12h  Height: 6' (182.9 cm) Weight: 125.2 kg (276 lb) IBW/kg (Calculated) : 77.6  Temp (24hrs), Avg:97.9 F (36.6 C), Min:97.7 F (36.5 C), Max:98.2 F (36.8 C)  Recent Labs  Lab 11/17/23 1810 11/17/23 1821 11/17/23 2030 11/18/23 0536 11/19/23 9361 11/20/23 0514 11/20/23 0912 11/20/23 1301  WBC 19.9*  --   --  22.0* 16.2* 12.1*  --   --   CREATININE 0.90  --   --  0.78 0.83  --   --   --   LATICACIDVEN  --  1.5 1.0  --   --   --   --   --   VANCOTROUGH  --   --   --   --   --   --  9*  --   VANCOPEAK  --   --   --   --   --   --   --  22*    Estimated Creatinine Clearance: 153.6 mL/min (by C-G formula based on SCr of 0.83 mg/dL).    Allergies  Allergen Reactions   Aspartame And Phenylalanine Anaphylaxis    Donny Alert, PharmD, [facility] Clinical Pharmacist Please see AMION for all Pharmacists' Contact Phone Numbers 11/20/2023, 3:00 PM   ADDENDUM:  Vancomycin  d/c'd with cultures only showing mixed anaerobic flora

## 2023-11-20 NOTE — Telephone Encounter (Signed)
 Pharmacy Patient Advocate Encounter  Insurance verification completed.    The patient is insured through [facility].     Ran test claim for Dexcom G7 Sensors and the current 30 day co-pay is $0.00.  Ran test claim for Lantus  Solostar and the current 30 day co-pay is $0.00.  Ran test claim for Insulin  lispro kwikpen and the current 30 day co-pay is $0.00.  This test claim was processed through [facility]- copay amounts may vary at other pharmacies due to pharmacy/plan contracts, or as the patient moves through the different stages of their insurance plan.

## 2023-11-20 NOTE — Progress Notes (Signed)
 Breif ID note  Or cx grew mixed anaerobes. No fruther debridement per surgery Transition to augmentin 875/125 po bid closer to discharge-> 10 days from OR eot 6/24  dermatology referral outpatient  to establish diagnosis of hydranitis  ID will SO

## 2023-11-20 NOTE — Inpatient Diabetes Management (Signed)
 Inpatient Diabetes Program Recommendations  AACE/ADA: New Consensus Statement on Inpatient Glycemic Control (2015)  Target Ranges:  Prepandial:   less than 140 mg/dL      Peak postprandial:   less than 180 mg/dL (1-2 hours)      Critically ill patients:  140 - 180 mg/dL   Lab Results  Component Value Date   GLUCAP 182 (H) 11/20/2023   HGBA1C 9.5 (H) 11/18/2023     Inpatient Diabetes Program Recommendations:    Discharge Recommendations: Other recommendations: Dexcom G7 [phone]) Long acting recommendations: Insulin  Glargine (LANTUS ) Solostar Pen 28 units daily  Short acting recommendations:  Meal coverage ONLY Insulin  lispro (HUMALOG) KwikPen  5 units tid with meals   Hypoglycemia treatment recommendations: Baqsimi 3mg  Supply/Referral recommendations: Glucometer Test strips Lancet device Lancets Pen needles - standard Referral to Nutrition & Diab Services   Use Adult Diabetes Insulin  Treatment Post Discharge order set.  Thanks,  Randall Bullocks, RN, BC-ADM Inpatient Diabetes Coordinator Pager [phone]  (8a-5p)

## 2023-11-20 NOTE — Progress Notes (Signed)
 [facility]  Date of Admission:  11/17/2023      Total days of antibiotics 4        Day 1: vancomycin , clindamycin , and zosyn         Day 2: vancomycin  and unasyn  and one dose of zosyn  3.375g        Day 3: vancomycin  and unasyn         Day 4: vancomycin  (one dose) and unasyn   43 year old male with past medical history of uncontrolled type 2 DM and HS.   ASSESSMENT and PLAN: Fourniers Gangrene -Patient said he first noticed pain in his left groin about a week ago and thought it was his HS. It eventually became bad enough that he went to the doctor who prescribed Bactrim . On Monday night 7/14, he recalls he was intensely sick (fevers, chills, rigors), while his wife was out getting the antibiotic. He went to bed that night and woke up the next morning without an appetite, felt he couldn't go to work, and noticed a lesion in his groin that wasn't previously there. He felt pain extend all the way back to his anus and up his lower back when he would sit a certain way. Him and his wife were able to hear air moving in the lesion and knew it was time to go to the ED.  -Blood Cultures in ED 7/15 pending results, no growth at 3 days. Started on empiric antibiotics. CT revealed Fourniers Gangrene in left perineum and left scrotum.  -Excisional debridement 7/16. Wound gram stain/cultures resulted in GNR, GPC, and mixed anaerobic flora.  -WBC trending down, 12.1 on 7/18. Afebrile.  -Will discontinue vancomycin , continue IV unasyn  DM -Patient has uncontrolled diabetes, A1c 9.6, which is a risk factor for infection  -Counseled on importance of controlling sugars for wound healing and to see PCP for treatment regimen and goals Hidradenitis Suppurativa -Patient has had HS in his (most notably right side) groin for years. HS is a risk factor for infection -Counseled to establish care with dermatology in outpatient setting   Principal Problem:   Fournier  gangrene    enoxaparin  (LOVENOX ) injection  60 mg Subcutaneous Q24H   insulin  aspart  0-20 Units Subcutaneous TID WC   insulin  aspart  0-5 Units Subcutaneous QHS   insulin  aspart  3 Units Subcutaneous TID WC   insulin  glargine-yfgn  25 Units Subcutaneous Daily   living well with diabetes book   Does not apply Once   multivitamin with minerals  1 tablet Oral Daily   Ensure Max Protein  11 oz Oral Daily    SUBJECTIVE: Patient doing well and not in a lot of pain. He has chicken salad at bedside to try and eat protein. He has been trying to increase water intake. He states he is comfortable staying in the hospital as long as his team feels he needs to.   Review of Systems: ROS  Allergies  Allergen Reactions   Aspartame And Phenylalanine Anaphylaxis    OBJECTIVE: Vitals:   11/19/23 0926 11/19/23 1652 11/20/23 0552 11/20/23 0900  BP: 133/88 103/60 (!) 121/91 (!) 148/100  Pulse: 81 86 79 79  Resp: 18 17 16 17   Temp: 98 F (36.7 C) 97.7 F (36.5 C) 98.2 F (36.8 C) 97.9 F (36.6 C)  TempSrc: Oral Oral Oral Oral  SpO2: 95% 95% 95% 97%  Weight:      Height:  Body mass index is 37.43 kg/m.  Physical Exam Constitutional:      Appearance: Normal appearance. He is not ill-appearing or toxic-appearing.  Eyes:     Extraocular Movements: Extraocular movements intact.  Pulmonary:     Effort: Pulmonary effort is normal.  Musculoskeletal:     Comments: Ambulated to and from bathroom by himself  Neurological:     Mental Status: He is alert and oriented to person, place, and time.     Lab Results Lab Results  Component Value Date   WBC 12.1 (H) 11/20/2023   HGB 14.4 11/20/2023   HCT 41.1 11/20/2023   MCV 88.4 11/20/2023   PLT 286 11/20/2023    Lab Results  Component Value Date   CREATININE 0.83 11/19/2023   BUN 13 11/19/2023   NA 132 (L) 11/19/2023   K 3.6 11/19/2023   CL 102 11/19/2023   CO2 18 (L) 11/19/2023    Lab Results  Component Value Date   ALT 17  11/17/2023   AST 16 11/17/2023   ALKPHOS 87 11/17/2023   BILITOT 0.7 11/17/2023     Microbiology: Recent Results (from the past 240 hours)  Blood culture (routine x 2)     Status: None (Preliminary result)   Collection Time: 11/17/23  7:58 PM   Specimen: BLOOD  Result Value Ref Range Status   Specimen Description BLOOD SITE NOT SPECIFIED  Final   Special Requests   Final    BOTTLES DRAWN AEROBIC AND ANAEROBIC Blood Culture adequate volume   Culture   Final    NO GROWTH 3 DAYS Performed at [facility], [address] [place], [place] [address]    Report Status PENDING  Incomplete  Blood culture (routine x 2)     Status: None (Preliminary result)   Collection Time: 11/17/23  8:03 PM   Specimen: BLOOD  Result Value Ref Range Status   Specimen Description BLOOD SITE NOT SPECIFIED  Final   Special Requests   Final    BOTTLES DRAWN AEROBIC AND ANAEROBIC Blood Culture adequate volume   Culture   Final    NO GROWTH 3 DAYS Performed at [facility], [address] [place], [place] [address]    Report Status PENDING  Incomplete  Aerobic/Anaerobic Culture w Gram Stain (surgical/deep wound)     Status: None   Collection Time: 11/17/23 11:35 PM   Specimen: Wound  Result Value Ref Range Status   Specimen Description WOUND  Final   Special Requests A  Final   Gram Stain   Final    FEW WBC PRESENT,BOTH PMN AND MONONUCLEAR ABUNDANT GRAM NEGATIVE RODS MODERATE GRAM POSITIVE COCCI IN PAIRS Performed at [facility], [address] [place], [place] [address]    Culture   Final    MIXED ANAEROBIC FLORA PRESENT.  CALL LAB IF FURTHER IID REQUIRED.   Report Status 11/20/2023 FINAL  Final  Aerobic/Anaerobic Culture w Gram Stain (surgical/deep wound)     Status: None   Collection Time: 11/17/23 11:39 PM   Specimen: Wound  Result Value Ref Range Status   Specimen Description WOUND  Final   Special Requests B  Final   Gram Stain   Final    NO WBC SEEN FEW GRAM  POSITIVE COCCI IN CLUSTERS FEW GRAM POSITIVE RODS Performed at [facility], [address] [place], [place] [address]    Culture   Final    MIXED ANAEROBIC FLORA PRESENT.  CALL  LAB IF FURTHER IID REQUIRED.   Report Status 11/20/2023 FINAL  Final    Viktoria King, DO, [facility] Internal Medicine Resident Physician  @TODAY @ 3:22 PM   Total Encounter Time: 10 minutes

## 2023-11-20 NOTE — Inpatient Diabetes Management (Signed)
 Inpatient Diabetes Program Recommendations  AACE/ADA: New Consensus Statement on Inpatient Glycemic Control (2015)  Target Ranges:  Prepandial:   less than 140 mg/dL      Peak postprandial:   less than 180 mg/dL (1-2 hours)      Critically ill patients:  140 - 180 mg/dL   Lab Results  Component Value Date   GLUCAP 182 (H) 11/20/2023   HGBA1C 9.5 (H) 11/18/2023    Review of Glycemic Control  Latest Reference Range & Units 11/19/23 12:48 11/19/23 16:54 11/19/23 22:41 11/20/23 09:00 11/20/23 12:14  Glucose-Capillary 70 - 99 mg/dL 779 (H) 794 (H) 771 (H) 174 (H) 182 (H)   Diabetes history: DM 2 Outpatient Diabetes medications: None Current orders for Inpatient glycemic control:  Novolog  0-20 units tid with meals and HS Semglee  25 units daily Novolog  3 units tid with meals   Inpatient Diabetes Program Recommendations:    Consider increasing Novolog  meal coverage to 5 units tid with meals (hold if patient eats less than 50% or NPO).   Educated patient on insulin  pen use at home.  Reviewed all steps if insulin  pen including attachment of needle, 2-unit air shot, dialing up dose, giving injection, removing needle, disposal of sharps, storage of unused insulin , disposal of insulin  etc.  Patient able to provide successful return demonstration.  Also reviewed troubleshooting with insulin  pen.  MD to give patient Rxs for insulin  pens and insulin  pen needles.   Explained to patient importance of Glycemic control for wound healing.  Discussed glucose goals.  He is wearing a Dexcom G7 CGM (from his wife).  Discussed that he may be able to come off of insulin  as wound improves and with lifestyle modifications.  May be a good candidate for GLP-1 (weekly) however he needs to continue to monitor and f/u with PCP for his entire life.  Reviewed signs and symptoms of hypoglycemia and how to treat.  Patient was able to teach back.    Thanks,  Randall Bullocks, RN, BC-ADM Inpatient Diabetes Coordinator Pager  [phone]  (8a-5p)

## 2023-11-20 NOTE — Progress Notes (Signed)
 Progress Note   Patient: Edward Booth DOB: [birth year] DOA: 11/17/2023     3 DOS: the patient was seen and examined on 11/20/2023   Brief hospital course: 43 y o man with PMH of T2DM, obesity who presented to the ED with complaints of scrotal pain.  Report of high-grade fever at home.  On presentation, he was tachycardic, labs showed leukocytosis.  Scrotum was edematous, erythematous, tender.  Cultures were taken.  Started on empiric antibiotics.  CT pelvis showed changes consistent with Fournier's gangrene in the left perineum and left scrotum.  Urology/general surgery consulted.  Underwent excisional debridement of the groin and left gluteus on 7/16.  Wound culture showing mixed organisms.  Currently on broad spectrum  antibiotic.  ID following.   Assessment and Plan:  Fournier's gangrene:  Presented with high-grade fever, scrotal pain.   No evidence of sepsis on admission.  Started on broad-spectrum antibiotics.  CT imaging as above.  General surgery, urology consulted.  Underwent excisional debridement of the left groin and gluteus. Wound culture showing mixed organisms.  Blood cultures no growth to date.   Foley Dced, patient voiding without difficulty.  -Continue Unasyn , vancomycin . ID following.    Type 2 diabetes:  A1c of 9.5 on  11/18/2023.  Does not take any medications at home.   - Continue insulin  regimen.   - Diabetic coordinator  consulted.   - Might need insulin  on discharge.     Class II Obesity:  Body mass index is 37.43 kg/m.  -For outpatient follow-up.     Subjective: Patient states that he is doing better.  Foley catheter was removed and he is voiding without difficulty.  He states his wife is able to do dressing changes.  Physical Exam: Vitals:   11/19/23 0926 11/19/23 1652 11/20/23 0552 11/20/23 0900  BP: 133/88 103/60 (!) 121/91 (!) 148/100  Pulse: 81 86 79 79  Resp: 18 17 16 17   Temp: 98 F (36.7 C) 97.7 F (36.5 C) 98.2 F (36.8 C)  97.9 F (36.6 C)  TempSrc: Oral Oral Oral Oral  SpO2: 95% 95% 95% 97%  Weight:      Height:       Physical Exam   General: Alert, oriented X3  Eyes: Pupils equal, reactive  Oral cavity: moist mucous membranes  Head: Atraumatic, normocephalic  Neck: supple  Chest: clear to auscultation. No crackles, no wheezes  CVS: S1,S2 RRR. No murmurs  Abd: No distention, soft, non-tender. No masses palpable  Perineum: Dressings in place. Extr: No edema   MSK: No joint deformities or swelling  Neurological: Grossly intact.    Data Reviewed:     Latest Ref Rng & Units 11/20/2023    5:14 AM 11/19/2023    6:38 AM 11/18/2023    5:36 AM  CBC  WBC 4.0 - 10.5 K/uL 12.1  16.2  22.0   Hemoglobin 13.0 - 17.0 g/dL 85.5  86.2  85.2   Hematocrit 39.0 - 52.0 % 41.1  39.2  43.0   Platelets 150 - 400 K/uL 286  235  207       Latest Ref Rng & Units 11/19/2023    6:38 AM 11/18/2023    5:36 AM 11/17/2023    6:10 PM  BMP  Glucose 70 - 99 mg/dL 783  717  682   BUN 6 - 20 mg/dL 13  9  8    Creatinine 0.61 - 1.24 mg/dL 9.16  9.21  9.09   Sodium 135 - 145  mmol/L 132  135  131   Potassium 3.5 - 5.1 mmol/L 3.6  4.8  3.8   Chloride 98 - 111 mmol/L 102  101  100   CO2 22 - 32 mmol/L 18  21  22    Calcium 8.9 - 10.3 mg/dL 8.3  8.7  9.1      Family Communication: Patient updated on plan of care  Disposition: Status is: Inpatient Remains inpatient appropriate because: Remains on IV antibiotics  Planned Discharge Destination: Home  DVT PPx: Lovenox  SQ    Time spent: 40 minutes  Author: MDALA-GAUSI, Mandeep Ferch AGATHA, MD 11/20/2023 1:44 PM  For on call review [email or web address].

## 2023-11-20 NOTE — Progress Notes (Addendum)
   3 Days Post-Op Subjective: First time meeting Edward Booth and family.  Reviewed case and plan.  Answered many questions regarding aftercare and long-term management.  Objective: Vital signs in last 24 hours: Temp:  [97.7 F (36.5 C)-98.2 F (36.8 C)] 97.9 F (36.6 C) (07/18 0900) Pulse Rate:  [79-86] 79 (07/18 0900) Resp:  [16-17] 17 (07/18 0900) BP: (103-148)/(60-100) 148/100 (07/18 0900) SpO2:  [95 %-97 %] 97 % (07/18 0900)  Assessment/Plan: # Fournier's gangrene # T2DM # Hidradenitis suppurativa  Discussed the correlation between Fournier's gangrene and his HS added to a new diabetes diagnosis.  Patient and his wife expressed understanding.  They plan to focus on strict glucose control and establishing with a dermatologist. Have viewed images collected from general surgery during wound care.  Appreciate them providing those.  There does not appear to be any notable scrotal involvement.  Urology will continue to follow peripherally.  Will attempt to join primary team for wound care prior to his discharge.  Intake/Output from previous day: 07/17 0701 - 07/18 0700 In: 360 [P.O.:360] Out: 1000 [Urine:1000]  Intake/Output this shift: No intake/output data recorded.  Physical Exam:  General: Alert and oriented CV: No cyanosis Lungs: equal chest rise Abdomen: Soft, NTND, no rebound or guarding Skin: Left groin wound   Lab Results: Recent Labs    11/18/23 0536 11/19/23 0638 11/20/23 0514  HGB 14.7 13.7 14.4  HCT 43.0 39.2 41.1   BMET Recent Labs    11/18/23 0536 11/19/23 0638 11/20/23 0514  NA 135 132*  --   K 4.8 3.6  --   CL 101 102  --   CO2 21* 18*  --   GLUCOSE 282* 216*  --   BUN 9 13  --   CREATININE 0.78 0.83  --   CALCIUM 8.7* 8.3*  --   HGB 14.7 13.7 14.4  WBC 22.0* 16.2* 12.1*     Studies/Results: No results found.    LOS: 3 days   Edward Bourdon, NP [facility] Pager: [phone]  11/20/2023, 2:59 PM

## 2023-11-20 NOTE — Progress Notes (Signed)
 Progress Note  3 Days Post-Op  Subjective: Patient reports that pain has been tolerable. Patient received pain medications prior to my arrival. He has had bowel movements. Urinating without concerns since removal of foley catheter.   ROS  All negative with the exception of above.  Objective: Vital signs in last 24 hours: Temp:  [97.7 F (36.5 C)-98.2 F (36.8 C)] 97.9 F (36.6 C) (07/18 0900) Pulse Rate:  [79-86] 79 (07/18 0900) Resp:  [16-17] 17 (07/18 0900) BP: (103-148)/(60-100) 148/100 (07/18 0900) SpO2:  [95 %-97 %] 97 % (07/18 0900) Last BM Date : 11/18/23  Intake/Output from previous day: 07/17 0701 - 07/18 0700 In: 360 [P.O.:360] Out: 1000 [Urine:1000] Intake/Output this shift: No intake/output data recorded.  PE: General: Pleasant, WD, obese male who is laying in bed in NAD. Heart: Regular, rate, and rhythm.  Lungs: Respiratory effort nonlabored Abd: Soft, nontender and non distended. GU: Left inguinal wound with involvement of left lateral scrotum. Wound extends toward left gluteal tissue. Tissue is clean and without purulent drainage. Foley catheter has been removed. Psych: A&Ox3 with an appropriate affect.    Lab Results:  Recent Labs    11/19/23 0638 11/20/23 0514  WBC 16.2* 12.1*  HGB 13.7 14.4  HCT 39.2 41.1  PLT 235 286   BMET Recent Labs    11/18/23 0536 11/19/23 0638  NA 135 132*  K 4.8 3.6  CL 101 102  CO2 21* 18*  GLUCOSE 282* 216*  BUN 9 13  CREATININE 0.78 0.83  CALCIUM 8.7* 8.3*   PT/INR No results for input(s): LABPROT, INR in the last 72 hours. CMP     Component Value Date/Time   NA 132 (L) 11/19/2023 0638   K 3.6 11/19/2023 0638   CL 102 11/19/2023 0638   CO2 18 (L) 11/19/2023 0638   GLUCOSE 216 (H) 11/19/2023 0638   BUN 13 11/19/2023 0638   CREATININE 0.83 11/19/2023 0638   CALCIUM 8.3 (L) 11/19/2023 0638   PROT 6.9 11/17/2023 1810   ALBUMIN 3.2 (L) 11/17/2023 1810   AST 16 11/17/2023 1810   ALT 17  11/17/2023 1810   ALKPHOS 87 11/17/2023 1810   BILITOT 0.7 11/17/2023 1810   GFRNONAA >60 11/19/2023 0638   GFRAA >60 02/07/2019 0436   Lipase     Component Value Date/Time   LIPASE 34 09/10/2017 2110       Studies/Results: No results found.  Anti-infectives: Anti-infectives (From admission, onward)    Start     Dose/Rate Route Frequency Ordered Stop   11/18/23 1200  Ampicillin -Sulbactam (UNASYN ) 3 g in sodium chloride  0.9 % 100 mL IVPB        3 g 200 mL/hr over 30 Minutes Intravenous Every 6 hours 11/18/23 1040     11/18/23 1000  vancomycin  (VANCOREADY) IVPB 1500 mg/300 mL  Status:  Discontinued        1,500 mg 150 mL/hr over 120 Minutes Intravenous 2 times daily 11/18/23 0502 11/18/23 0509   11/18/23 1000  vancomycin  (VANCOREADY) IVPB 1750 mg/350 mL        1,750 mg 175 mL/hr over 120 Minutes Intravenous 2 times daily 11/18/23 0509     11/18/23 0400  piperacillin -tazobactam (ZOSYN ) IVPB 3.375 g  Status:  Discontinued        3.375 g 12.5 mL/hr over 240 Minutes Intravenous Every 8 hours 11/18/23 0224 11/18/23 1040   11/17/23 2015  vancomycin  (VANCOREADY) IVPB 2000 mg/400 mL  Status:  Discontinued  2,000 mg 200 mL/hr over 120 Minutes Intravenous  Once 11/17/23 2008 11/17/23 2009   11/17/23 2015  vancomycin  (VANCOCIN ) 2,500 mg in sodium chloride  0.9 % 500 mL IVPB        2,500 mg 262.5 mL/hr over 120 Minutes Intravenous  Once 11/17/23 2009 11/18/23 0007   11/17/23 2000  piperacillin -tazobactam (ZOSYN ) IVPB 3.375 g        3.375 g 100 mL/hr over 30 Minutes Intravenous  Once 11/17/23 1957 11/17/23 2101   11/17/23 2000  clindamycin  (CLEOCIN ) IVPB 300 mg        300 mg 100 mL/hr over 30 Minutes Intravenous  Once 11/17/23 1957 11/17/23 2149        Assessment/Plan NSTI of left groin/perineum S/P I&D by Dr. Dasie and Dr. Carolee 7/15-7/16 - WBC 12.1 from 16.2 (7/17). Patient remains afebrile. - Dressing changed at bedside. Will assess PRN over the weekend. - Dressing  changes should be wet to dry and completed daily or as needed with BM. - Continue to mobilize as tolerated. - Continue IV antibiotics; Gram stain showed gram positive cocci in clusters and gram positive rods. Culture still in preliminary stages - No additional procedures needed at this time. - Urology and I&D following.  FEN: : CM/HH diet  VTE: Enoxaparin  injection ID: Vanc/unasyn ; Cx 7/15 preliminary stages  - per [facility] -  Hidradenitis suppurativa T2DM, uncontrolled   LOS: 3 days    Marjorie Carlyon Favre, [facility] 11/20/2023, 10:23 AM Please see Amion for pager number during day hours 7:00am-4:30pm

## 2023-11-20 NOTE — Plan of Care (Signed)

## 2023-11-21 DIAGNOSIS — N493 Fournier gangrene: Secondary | ICD-10-CM | POA: Diagnosis not present

## 2023-11-21 LAB — GLUCOSE, CAPILLARY
Glucose-Capillary: 159 mg/dL — ABNORMAL HIGH (ref 70–99)
Glucose-Capillary: 174 mg/dL — ABNORMAL HIGH (ref 70–99)
Glucose-Capillary: 189 mg/dL — ABNORMAL HIGH (ref 70–99)
Glucose-Capillary: 188 mg/dL — ABNORMAL HIGH (ref 70–99)

## 2023-11-21 LAB — BASIC METABOLIC PANEL WITH GFR
Anion gap: 7 (ref 5–15)
BUN: 9 mg/dL (ref 6–20)
CO2: 22 mmol/L (ref 22–32)
Calcium: 8.4 mg/dL — ABNORMAL LOW (ref 8.9–10.3)
Chloride: 106 mmol/L (ref 98–111)
Creatinine, Ser: 0.64 mg/dL (ref 0.61–1.24)
GFR, Estimated: >60 mL/min (ref >60)
Glucose, Bld: 148 mg/dL — ABNORMAL HIGH (ref 70–99)
Potassium: 3.3 mmol/L — ABNORMAL LOW (ref 3.5–5.1)
Sodium: 135 mmol/L (ref 135–145)

## 2023-11-21 LAB — CBC
HCT: 39.6 % (ref 39.0–52.0)
Hemoglobin: 13.7 g/dL (ref 13.0–17.0)
MCH: 30.3 pg (ref 26.0–34.0)
MCHC: 34.6 g/dL (ref 30.0–36.0)
MCV: 87.6 fL (ref 80.0–100.0)
Platelets: 276 K/uL (ref 150–400)
RBC: 4.52 MIL/uL (ref 4.22–5.81)
RDW: 13.2 % (ref 11.5–15.5)
WBC: 10.2 K/uL (ref 4.0–10.5)
nRBC: 0 % (ref 0.0–0.2)

## 2023-11-21 MED ORDER — ACETAMINOPHEN 500 MG PO TABS
1000.0000 mg | ORAL_TABLET | Freq: Four times a day (QID) | ORAL | Status: DC
  Administered 2023-11-21 – 2023-11-22 (×5): 1000 mg via ORAL
  Filled 2023-11-21 (×5): qty 2

## 2023-11-21 NOTE — Plan of Care (Signed)

## 2023-11-21 NOTE — Plan of Care (Signed)
 Dressing L groin changed as per orders.    Problem: Education: Goal: Knowledge of General Education information will improve Description: Including pain rating scale, medication(s)/side effects and non-pharmacologic comfort measures Outcome: Progressing   Problem: Health Behavior/Discharge Planning: Goal: Ability to manage health-related needs will improve Outcome: Progressing   Problem: Clinical Measurements: Goal: Ability to maintain clinical measurements within normal limits will improve Outcome: Progressing Goal: Will remain free from infection Outcome: Progressing Goal: Diagnostic test results will improve Outcome: Progressing Goal: Respiratory complications will improve Outcome: Progressing Goal: Cardiovascular complication will be avoided Outcome: Progressing

## 2023-11-21 NOTE — Progress Notes (Signed)
 [facility] ROUNDING NOTE Edward Booth FMW:[id number]  DOB: 1980/05/20  DOA: 11/17/2023  PCP: Pcp, No  11/21/2023,6:26 AM  LOS: 4 days    Code Status: Full code   from: Home current Dispo: Unclear    43 year old white male Known diabetes mellitus since 2020 Apparent history of hidradenitis suppurativa since around 2018 Tobacco/asthma/previous diagnosis of wheeze Previous groin abscess on the right side seen at urgent care in 2023 apparently per chart-?  [facility] [place]   7/14 seen at family medicine with virtual visit-told practitioner abscess in left buttock gluteal fold abscess in perineal region posterior testicular region and 1 in the pilonidal area--- was trialing Hibiclens  antibiotics Epsom salts and ibuprofen -because of foul odor purulent discharge prescribed Bactrim  ibuprofen  and told to follow-up if any worse in person 7/15 present [name] rapidly progressive pain swelling into groin sepsis physiology Tmax 100.2 pulse rate 109 WBC 19 CT scan gas and subcutaneous right groin-urgent surgery Dr. Leonor and urologist Dr. Carolee consulted--hospitalist  admitted once procedures done 7/16 ID consulted adjusted antibiotics to Unasyn /vancomycin --culture subsequently grew mixed anaerobes and antibiotics adjusted    Plan  Fournier's gangrene Defer to both general surgery as well as urology-wounds to be dressed as per them-Oxy IR 5-10 every 4 as needed Dilaudid  0.5 every 2 as needed Continues on Unasyn  200 every 6 and will transition to oral medication 7/24 Augmentin  per ID  Hidradenitis suppurativa diagnosed 2018 Needs establish care with dermatology-does not have a PCP?  TOC to assist  DM TY 2 A1c 9.5 Was prediabetic in 2000-used to take metformin  and is willing to try this again CBGs 160-270-continues Lantus  25, 3 times daily NovoLog  with resistant sliding scale and at bedtime He demonstrated ability to give himself insulin  today as he has had to give it to his mom previously Start metformin   500 XL.today  Hyponatremia on admit--current mild hypokalemia Recheck potassium tomorrow no replacement today Sodium is improved  Class II obesity BMI 37 Previous smoker with asthma/COPD overlap  Data Reviewed:  Sodium 135 potassium 3.3 BUN/creatinine 9/0.6 WBC zenith 22 7/16-->10.2   DVT prophylaxis: Lovenox   Status is: Inpatient Remains inpatient appropriate because: Requires further management      Subjective: Awake coherent 6/10 pain with mobility-patient encouraged to get up and move around    Objective + exam Vitals:   11/20/23 1749 11/20/23 1753 11/20/23 2113 11/21/23 0445  BP:  (!) 122/90 (!) 143/96 (!) 134/99  Pulse: 92 93 80 66  Resp:  17 16   Temp:  98.7 F (37.1 C) 98 F (36.7 C) 97.6 F (36.4 C)  TempSrc:  Oral Oral Oral  SpO2: 97% 97% 99% 96%  Weight:      Height:       Filed Weights   11/17/23 2238  Weight: 125.2 kg    Examination: EOMI NCAT thick neck Mallampati 4 pleasant S1-S2 no murmur Chest is clear no wheeze Abdomen is soft no rebound ROM is intact Power 5/5 I did not examine wounds today   Scheduled Meds:  enoxaparin  (LOVENOX ) injection  60 mg Subcutaneous Q24H   insulin  aspart  0-20 Units Subcutaneous TID WC   insulin  aspart  0-5 Units Subcutaneous QHS   insulin  aspart  3 Units Subcutaneous TID WC   insulin  glargine-yfgn  25 Units Subcutaneous Daily   living well with diabetes book   Does not apply Once   multivitamin with minerals  1 tablet Oral Daily   Ensure Max Protein  11 oz Oral Daily  Continuous Infusions:  ampicillin -sulbactam (UNASYN ) IV 3 g (11/21/23 0602)   sodium chloride       Time  45  [name], MD  [facility]

## 2023-11-22 DIAGNOSIS — N493 Fournier gangrene: Secondary | ICD-10-CM | POA: Diagnosis not present

## 2023-11-22 LAB — CULTURE, BLOOD (ROUTINE X 2)
Culture: NO GROWTH
Culture: NO GROWTH
Special Requests: ADEQUATE
Special Requests: ADEQUATE

## 2023-11-22 LAB — GLUCOSE, CAPILLARY
Glucose-Capillary: 175 mg/dL — ABNORMAL HIGH (ref 70–99)
Glucose-Capillary: 210 mg/dL — ABNORMAL HIGH (ref 70–99)

## 2023-11-22 LAB — BASIC METABOLIC PANEL WITH GFR
Anion gap: 11 (ref 5–15)
BUN: 13 mg/dL (ref 6–20)
CO2: 21 mmol/L — ABNORMAL LOW (ref 22–32)
Calcium: 8.4 mg/dL — ABNORMAL LOW (ref 8.9–10.3)
Chloride: 104 mmol/L (ref 98–111)
Creatinine, Ser: 0.54 mg/dL — ABNORMAL LOW (ref 0.61–1.24)
GFR, Estimated: >60 mL/min (ref >60)
Glucose, Bld: 146 mg/dL — ABNORMAL HIGH (ref 70–99)
Potassium: 3.7 mmol/L (ref 3.5–5.1)
Sodium: 136 mmol/L (ref 135–145)

## 2023-11-22 LAB — CBC
HCT: 39.9 % (ref 39.0–52.0)
Hemoglobin: 14 g/dL (ref 13.0–17.0)
MCH: 30.7 pg (ref 26.0–34.0)
MCHC: 35.1 g/dL (ref 30.0–36.0)
MCV: 87.5 fL (ref 80.0–100.0)
Platelets: 299 K/uL (ref 150–400)
RBC: 4.56 MIL/uL (ref 4.22–5.81)
RDW: 13.2 % (ref 11.5–15.5)
WBC: 11.1 K/uL — ABNORMAL HIGH (ref 4.0–10.5)
nRBC: 0 % (ref 0.0–0.2)

## 2023-11-22 MED ORDER — INSULIN GLARGINE-YFGN 100 UNIT/ML ~~LOC~~ SOPN: 25.0000 [IU] | PEN_INJECTOR | Freq: Every day | SUBCUTANEOUS | 0 refills | Status: AC

## 2023-11-22 MED ORDER — INSULIN ASPART 100 UNIT/ML FLEXPEN: 3.0000 [IU] | PEN_INJECTOR | Freq: Three times a day (TID) | SUBCUTANEOUS | 0 refills | Status: AC

## 2023-11-22 MED ORDER — LANCETS MISC: 1.0000 | Freq: Three times a day (TID) | 0 refills | Status: AC

## 2023-11-22 MED ORDER — OXYCODONE HCL 5 MG PO TABS: 5.0000 mg | ORAL_TABLET | ORAL | 0 refills | Status: AC | PRN

## 2023-11-22 MED ORDER — BLOOD GLUCOSE MONITORING SUPPL DEVI: 1.0000 | Freq: Three times a day (TID) | 0 refills | Status: AC

## 2023-11-22 MED ORDER — BAQSIMI TWO PACK 3 MG/DOSE NA POWD: 3.0000 mg | NASAL | 0 refills | Status: AC | PRN

## 2023-11-22 MED ORDER — BLOOD GLUCOSE TEST VI STRP: 1.0000 | ORAL_STRIP | Freq: Three times a day (TID) | 0 refills | Status: AC

## 2023-11-22 MED ORDER — METFORMIN HCL ER (OSM) 500 MG PO TB24
500.0000 mg | ORAL_TABLET | Freq: Every day | ORAL | 1 refills | Status: AC
Start: 2023-11-22 — End: ?

## 2023-11-22 MED ORDER — AMOXICILLIN-POT CLAVULANATE 875-125 MG PO TABS: 1.0000 | ORAL_TABLET | Freq: Two times a day (BID) | ORAL | 0 refills | Status: AC

## 2023-11-22 MED ORDER — LANCET DEVICE MISC: 1.0000 | Freq: Three times a day (TID) | 0 refills | Status: AC

## 2023-11-22 MED ORDER — PEN NEEDLES 31G X 5 MM MISC: 1.0000 | Freq: Three times a day (TID) | 0 refills | Status: AC

## 2023-11-22 NOTE — Progress Notes (Incomplete)
 [facility] ROUNDING NOTE Edward Booth FMW:[id number]  DOB: 07/29/1980  DOA: 11/17/2023  PCP: Pcp, No  11/22/2023,9:52 AM  LOS: 5 days    Code Status: Full code   from: Home current Dispo: Unclear    43 year old white male Known diabetes mellitus since 2020 Apparent history of hidradenitis suppurativa since around 2018 Tobacco/asthma/previous diagnosis of wheeze Previous groin abscess on the right side seen at urgent care in 2023 apparently per chart-?  [facility] [place]   7/14 seen at family medicine with virtual visit-told practitioner abscess in left buttock gluteal fold abscess in perineal region posterior testicular region and 1 in the pilonidal area--- was trialing Hibiclens  antibiotics Epsom salts and ibuprofen -because of foul odor purulent discharge prescribed Bactrim  ibuprofen  and told to follow-up if any worse in person 7/15 present [facility] rapidly progressive pain swelling into groin sepsis physiology Tmax 100.2 pulse rate 109 WBC 19 CT scan gas and subcutaneous right groin-urgent surgery Dr. Leonor and urologist Dr. Carolee consulted--hospitalist  admitted once procedures done 7/16 ID consulted adjusted antibiotics to Unasyn /vancomycin --culture subsequently grew mixed anaerobes and antibiotics adjusted    Plan  Fournier's gangrene Defer to both general surgery as well as urology-wounds to be dressed as per them-Oxy IR 5-10 every 4 as needed Dilaudid  0.5 every 2 as needed Continues on Unasyn  200 every 6 and will transition to oral medication EOT 7/24 closer to discharge to complete Augmentin   Hidradenitis suppurativa diagnosed 2018 Needs establish care with dermatology-does not have a PCP?  TOC to assist  DM TY 2 A1c 9.5 prediabetic in 2000-used to take metformin   Lantus  25, 3 times daily NovoLog  with resistant sliding scale and at bedtime He demonstrated ability to give himself insulin  today as he has had to give it to his mom previously Start metformin  500 XL.today  Hyponatremia  on admit--current mild hypokalemia Recheck potassium tomorrow no replacement today Sodium is improved  Class II obesity BMI 37 Previous smoker with asthma/COPD overlap  Data Reviewed:  Sodium 136 potassium 3.7 BUN/creatinine 13/0.5 anion gap 11 WBC 11.1 up from 10.2 platelet 299 CBG trending predominantly 140-180  DVT prophylaxis: Lovenox   Status is: Inpatient Remains inpatient appropriate because: Requires further management      Subjective:     Objective + exam Vitals:   11/21/23 2000 11/21/23 2000 11/22/23 0604 11/22/23 0800  BP: (!) 138/92 (!) 138/92 (!) 148/99 (!) 140/90  Pulse: 87 87 76 67  Resp: 18 16 17    Temp: 98.2 F (36.8 C) 98.2 F (36.8 C) 97.8 F (36.6 C) 97.7 F (36.5 C)  TempSrc: Oral Oral Oral Oral  SpO2: 97% 97% 98% 100%  Weight:      Height:       Filed Weights   11/17/23 2238  Weight: 125.2 kg    Examination:     Scheduled Meds:  acetaminophen   1,000 mg Oral Q6H   enoxaparin  (LOVENOX ) injection  60 mg Subcutaneous Q24H   insulin  aspart  0-20 Units Subcutaneous TID WC   insulin  aspart  0-5 Units Subcutaneous QHS   insulin  aspart  3 Units Subcutaneous TID WC   insulin  glargine-yfgn  25 Units Subcutaneous Daily   living well with diabetes book   Does not apply Once   multivitamin with minerals  1 tablet Oral Daily   Ensure Max Protein  11 oz Oral Daily   Continuous Infusions:  ampicillin -sulbactam (UNASYN ) IV 3 g (11/22/23 9380)   sodium chloride       Time  30  Jai-Gurmukh Rameen Gohlke,  MD  [facility]

## 2023-11-22 NOTE — TOC Transition Note (Signed)
 Transition of Care [facility]) - Discharge Note   Patient Details  Name: BRENNIN DURFEE MRN: [id number] Date of Birth: 03-09-1981  [facility]) CM/SW Contact:  Robynn Eileen Hoose, RN Phone Number: 11/22/2023, 11:27 AM   Clinical Narrative:   Patient is being discharged home today. SPoke with patient, reports that he does not need HH RN services at this time. His wife will be able to do dressing changes as she works in [employer].    Final next level of care: Home/Self Care Barriers to Discharge: No Barriers Identified   Patient Goals and CMS Choice            Discharge Placement                       Discharge Plan and Services Additional resources added to the After Visit Summary for                                       Social Drivers of Health (SDOH) Interventions SDOH Screenings   Food Insecurity: No Food Insecurity (11/18/2023)  Housing: Low Risk  (11/18/2023)  Transportation Needs: No Transportation Needs (11/18/2023)  Utilities: Not At Risk (11/18/2023)  Tobacco Use: High Risk (11/17/2023)     Readmission Risk Interventions     No data to display

## 2023-11-22 NOTE — Progress Notes (Addendum)
 Progress Note  5 Days Post-Op  Subjective: No new complaints. States his wife was a paramedic for a long time and can help with wound care   WBC 11.1 from 10.2  Dr. Royal at bedside with me for wound check. ROS  All negative with the exception of above.  Objective: Vital signs in last 24 hours: Temp:  [97.7 F (36.5 C)-98.4 F (36.9 C)] 97.7 F (36.5 C) (07/20 0800) Pulse Rate:  [67-87] 67 (07/20 0800) Resp:  [16-18] 17 (07/20 0604) BP: (138-148)/(88-99) 140/90 (07/20 0800) SpO2:  [95 %-100 %] 100 % (07/20 0800) Last BM Date : 11/21/23  Intake/Output from previous day: 07/19 0701 - 07/20 0700 In: 3480 [P.O.:2280; IV Piggyback:1200] Out: -  Intake/Output this shift: Total I/O In: 480 [P.O.:480] Out: -   PE: General: Pleasant, WD, obese male who is laying in bed in NAD. Heart: Regular, rate, and rhythm.  Lungs: Respiratory effort nonlabored Abd: Soft, nontender and non distended. GU: Left inguinal wound with involvement of left lateral scrotum. Wound extends toward left gluteal tissue. Tissue is clean and without purulent drainage. No cellulitis.  Psych: A&Ox3 with an appropriate affect.    Lab Results:  Recent Labs    11/21/23 0358 11/22/23 0720  WBC 10.2 11.1*  HGB 13.7 14.0  HCT 39.6 39.9  PLT 276 299   BMET Recent Labs    11/21/23 0358 11/22/23 0720  NA 135 136  K 3.3* 3.7  CL 106 104  CO2 22 21*  GLUCOSE 148* 146*  BUN 9 13  CREATININE 0.64 0.54*  CALCIUM 8.4* 8.4*   PT/INR No results for input(s): LABPROT, INR in the last 72 hours. CMP     Component Value Date/Time   NA 136 11/22/2023 0720   K 3.7 11/22/2023 0720   CL 104 11/22/2023 0720   CO2 21 (L) 11/22/2023 0720   GLUCOSE 146 (H) 11/22/2023 0720   BUN 13 11/22/2023 0720   CREATININE 0.54 (L) 11/22/2023 0720   CALCIUM 8.4 (L) 11/22/2023 0720   PROT 6.9 11/17/2023 1810   ALBUMIN 3.2 (L) 11/17/2023 1810   AST 16 11/17/2023 1810   ALT 17 11/17/2023 1810   ALKPHOS 87  11/17/2023 1810   BILITOT 0.7 11/17/2023 1810   GFRNONAA >60 11/22/2023 0720   GFRAA >60 02/07/2019 0436   Lipase     Component Value Date/Time   LIPASE 34 09/10/2017 2110       Studies/Results: No results found.  Anti-infectives: Anti-infectives (From admission, onward)    Start     Dose/Rate Route Frequency Ordered Stop   11/22/23 0000  amoxicillin -clavulanate (AUGMENTIN ) 875-125 MG tablet        1 tablet Oral 2 times daily 11/22/23 1121 11/26/23 2359   11/20/23 2200  vancomycin  (VANCOREADY) IVPB 2000 mg/400 mL  Status:  Discontinued        2,000 mg 200 mL/hr over 120 Minutes Intravenous 2 times daily 11/20/23 1501 11/20/23 1521   11/18/23 1200  Ampicillin -Sulbactam (UNASYN ) 3 g in sodium chloride  0.9 % 100 mL IVPB        3 g 200 mL/hr over 30 Minutes Intravenous Every 6 hours 11/18/23 1040     11/18/23 1000  vancomycin  (VANCOREADY) IVPB 1500 mg/300 mL  Status:  Discontinued        1,500 mg 150 mL/hr over 120 Minutes Intravenous 2 times daily 11/18/23 0502 11/18/23 0509   11/18/23 1000  vancomycin  (VANCOREADY) IVPB 1750 mg/350 mL  Status:  Discontinued  1,750 mg 175 mL/hr over 120 Minutes Intravenous 2 times daily 11/18/23 0509 11/20/23 1501   11/18/23 0400  piperacillin -tazobactam (ZOSYN ) IVPB 3.375 g  Status:  Discontinued        3.375 g 12.5 mL/hr over 240 Minutes Intravenous Every 8 hours 11/18/23 0224 11/18/23 1040   11/17/23 2015  vancomycin  (VANCOREADY) IVPB 2000 mg/400 mL  Status:  Discontinued        2,000 mg 200 mL/hr over 120 Minutes Intravenous  Once 11/17/23 2008 11/17/23 2009   11/17/23 2015  vancomycin  (VANCOCIN ) 2,500 mg in sodium chloride  0.9 % 500 mL IVPB        2,500 mg 262.5 mL/hr over 120 Minutes Intravenous  Once 11/17/23 2009 11/18/23 0007   11/17/23 2000  piperacillin -tazobactam (ZOSYN ) IVPB 3.375 g        3.375 g 100 mL/hr over 30 Minutes Intravenous  Once 11/17/23 1957 11/17/23 2101   11/17/23 2000  clindamycin  (CLEOCIN ) IVPB 300 mg         300 mg 100 mL/hr over 30 Minutes Intravenous  Once 11/17/23 1957 11/17/23 2149        Assessment/Plan NSTI of left groin/perineum S/P I&D by Dr. Dasie and Dr. Carolee 7/15-7/16 - WBC slightly up but no evidence of worsening infection on physical exam. Patient remains afebrile. - Dressing changed at bedside. Will assess PRN over the weekend. - Dressing changes should be wet to dry and completed daily or as needed with BM. - Continue to mobilize as tolerated. - Gram stain showed gram positive cocci in clusters and gram positive rods. Culture still in preliminary stages - No additional procedures needed at this time. Outpatient follow up arranged. Continue PO abx (augmentin  ok) for 7 days. FEN: : CM/HH diet  VTE: Enoxaparin  injection ID: Vanc/unasyn ; Cx 7/15 preliminary stages  - per [facility] -  Hidradenitis suppurativa T2DM, uncontrolled   LOS: 5 days    Almarie GORMAN Pringle, [facility] 11/22/2023, 11:28 AM Please see Amion for pager number during day hours 7:00am-4:30pm

## 2023-11-22 NOTE — Progress Notes (Signed)
 AVS completed and place with chart. Delay in discharge: IV Antibx due.

## 2023-11-22 NOTE — Discharge Summary (Signed)
 Physician Discharge Summary  Edward Booth FMW:[id number] DOB: [birth year] DOA: 11/17/2023  PCP: Pcp, No  Admit date: 11/17/2023 Discharge date: 11/22/2023  Time spent: 33 minutes  Recommendations for Outpatient Follow-up:  Needs wet-to-dry dressings and outpatient wound follow-up with general surgery--CC Dr. Ann New start insulin  this admission plan resumption metformin -needs outpatient close coordination and needs PCP to help adjust meds etc. Recommend Chem-12 CBC about 1 week from discharge Recommend A1c 3 months  Discharge Diagnoses:  MAIN problem for hospitalization   Fournier's gangrene with extension status post surgery New onset diabetes mellitus   Please see below for itemized issues addressed in HOpsital- refer to other progress notes for clarity if needed  Discharge Condition: Improved  Diet recommendation: Diabetic  Filed Weights   11/17/23 2238  Weight: 125.2 kg    History of present illness:  43 year old white male Known diabetes mellitus since 2020 Apparent history of hidradenitis suppurativa since around 2018 Tobacco/asthma/previous diagnosis of wheeze Previous groin abscess on the right side seen at urgent care in 2023 apparently per chart-?  [facility]    7/14 seen at family medicine with virtual visit-told practitioner abscess in left buttock gluteal fold abscess in perineal region posterior testicular region and 1 in the pilonidal area--- was trialing Hibiclens  antibiotics Epsom salts and ibuprofen -because of foul odor purulent discharge prescribed Bactrim  ibuprofen  and told to follow-up if any worse in person 7/15 present [facility] rapidly progressive pain swelling into groin sepsis physiology Tmax 100.2 pulse rate 109 WBC 19 CT scan gas and subcutaneous right groin-urgent surgery Dr. Leonor and urologist Dr. Carolee consulted--hospitalist  admitted once procedures done 7/16 ID consulted adjusted antibiotics to Unasyn /vancomycin --culture  subsequently grew mixed anaerobes and antibiotics adjusted       Plan   Fournier's gangrene Defer to both general surgery as well as urology-wounds to be dressed as per them-Oxy IR 5-10 every 4 as needed at discharge for severe pain can take Tylenol  and ibuprofen  for milder pain At discharge transition from Unasyn  200 every 6 and will transition to oral medication 7/24 Augmentin  per ID Wound checks etc. as per general surgery-patient's wife is a former paramedic and she should be able to help with the same   Hidradenitis suppurativa diagnosed 2018 Needs establish care with dermatology-does not have a PCP?  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 Was prediabetic in 2000-used to take metformin  and is willing to try this again-started at 500 XL at discharge Going home on Levemir 25, 3 times daily NovoLog  3 units --- will need tinkering and adjustment of the same demonstrated ability to give himself insulin  today as he has had to give it to his mom previously Start metformin  500 XL at discharge-told to stop if he has diarrhea   Hyponatremia on admit--current mild hypokalemia Both improved at discharge   Class II obesity BMI 37 Previous smoker with asthma/COPD overlap  Discharge Exam: Vitals:   11/22/23 0604 11/22/23 0800  BP: (!) 148/99 (!) 140/90  Pulse: 76 67  Resp: 17   Temp: 97.8 F (36.6 C) 97.7 F (36.5 C)  SpO2: 98% 100%    Subj on day of d/c   Awake well no distress pain moderate but able to tolerate the dressing change Myself and general surgery looked at the wounds together and he has no fever no chills   General Exam on discharge  EOMI NCAT pleasant no distress EOMI NCAT no focal deficit no icterus or pallor Neck soft supple Abdomen soft  no rebound Wound examined with general surgery staff  Discharge Instructions   Discharge Instructions     Diet - low sodium heart healthy   Complete by: As directed    Diet Carb Modified   Complete by: As directed     Discharge instructions   Complete by: As directed    Complete all Augmentin  per Infectious disease doc.  Make sure you continue the wet to dry dressings as per surgery instructions We will give u pain meds--take tylenol  for mild, ibuprofen  your home dose for moderate and us  ethe oxycodone  for pain >7/10 Report drainage odor fever immediately to primary md We will prescribe an insulin  called Levemir as a pen--use 25 units--this is long acting insulin  takes once daily We will also prescribe regular insulin  3 U tid I will start a very low dose metformin  XL 500--if u have diarrhea with it -STOP it and talk to ur primary about alternatives   Discharge wound care:   Complete by: As directed    11/19/23 0900    Wound care  Daily      Comments: Change left groin/perineal dressing daily or PRN if soiled Pack saline moistened kerlix into wound base and trim excess. Cover with ABD and mesh briefs   Increase activity slowly   Complete by: As directed       Allergies as of 11/22/2023       Reactions   Aspartame And Phenylalanine Anaphylaxis        Medication List     STOP taking these medications    meloxicam 15 MG tablet Commonly known as: MOBIC   sulfamethoxazole -trimethoprim  800-160 MG tablet Commonly known as: BACTRIM  DS       TAKE these medications    acetaminophen  325 MG tablet Commonly known as: TYLENOL  Take 2 tablets (650 mg total) by mouth every 6 (six) hours as needed for mild pain (or Fever >/= 101).   amoxicillin -clavulanate 875-125 MG tablet Commonly known as: AUGMENTIN  Take 1 tablet by mouth 2 (two) times daily for 4 days.   Baqsimi  Two Pack 3 MG/DOSE Powd Generic drug: Glucagon  Place 3 mg into the nose as needed for up to 2 doses (Severe low blood sugar). Give 3 mg (one actuation) into a single nostril.   Blood Glucose Monitoring Suppl Devi 1 each by Does not apply route 3 (three) times daily. May dispense any manufacturer covered by patient's insurance.    BLOOD GLUCOSE TEST STRIPS Strp 1 each by Does not apply route 3 (three) times daily. Use as directed to check blood sugar. May dispense any manufacturer covered by patient's insurance and fits patient's device.   ibuprofen  800 MG tablet Commonly known as: ADVIL  Take 1 tablet (800 mg total) by mouth every 8 (eight) hours as needed. What changed: reasons to take this   insulin  aspart 100 UNIT/ML FlexPen Commonly known as: NOVOLOG  Inject 3 Units into the skin 3 (three) times daily with meals. Only take if eating a meal AND Blood Glucose (BG) is 80 or higher.   insulin  glargine-yfgn 100 UNIT/ML Pen Commonly known as: SEMGLEE  Inject 25 Units into the skin daily. May substitute as needed per insurance.   Lancet Device Misc 1 each by Does not apply route 3 (three) times daily. May dispense any manufacturer covered by patient's insurance.   Lancets Misc 1 each by Does not apply route 3 (three) times daily. Use as directed to check blood sugar. May dispense any manufacturer covered by patient's insurance and fits patient's  device.   oxyCODONE  5 MG immediate release tablet Commonly known as: Oxy IR/ROXICODONE  Take 1-2 tablets (5-10 mg total) by mouth every 4 (four) hours as needed (5mg  for moderate pain, 10mg  for severe pain).   Pen Needles 31G X 5 MM Misc 1 each by Does not apply route 3 (three) times daily. May dispense any manufacturer covered by patient's insurance.               Discharge Care Instructions  (From admission, onward)           Start     Ordered   11/22/23 0000  Discharge wound care:       Comments: 11/19/23 0900    Wound care  Daily      Comments: Change left groin/perineal dressing daily or PRN if soiled Pack saline moistened kerlix into wound base and trim excess. Cover with ABD and mesh briefs   11/22/23 1121           Allergies  Allergen Reactions   Aspartame And Phenylalanine Anaphylaxis    Follow-up Information     Maczis, Puja Gosai,  PA-C. Go on 12/11/2023.   Specialty: General Surgery Why: 9:00 AM, please arrive 30 min prior to appointment time to check in. Contact information: [address] [facility] [place] [address] [phone]                  The results of significant diagnostics from this hospitalization (including imaging, microbiology, ancillary and laboratory) are listed below for reference.    Significant Diagnostic Studies: CT PELVIS W CONTRAST Result Date: 11/17/2023 CLINICAL DATA:  Evaluate for perianal abscess EXAM: CT PELVIS WITH CONTRAST TECHNIQUE: Multidetector CT imaging of the pelvis was performed using the standard protocol following the bolus administration of intravenous contrast. RADIATION DOSE REDUCTION: This exam was performed according to the departmental dose-optimization program which includes automated exposure control, adjustment of the mA and/or kV according to patient size and/or use of iterative reconstruction technique. CONTRAST:  75mL OMNIPAQUE  IOHEXOL  350 MG/ML SOLN COMPARISON:  Ultrasound from earlier in the same day. FINDINGS: Urinary Tract: Bladder is well distended. Kidneys are not evaluated in this study. Bowel: No obstructive or inflammatory changes of the colon are seen. Scattered diverticular changes noted. Vascular/Lymphatic: Atherosclerotic calcifications are noted without acute abnormality. Reproductive: Prostate is within normal limits. Cystic structure is again noted on the left likely representing an epididymal cyst or spermatocele stable from prior ultrasound. Other:  No free fluid is seen. Musculoskeletal: No acute bony abnormality is noted. No findings to suggest perianal abscess are seen. There are however significant changes in the left hemiscrotum and left perineum with edema and multifocal areas of air consistent with Fournier's gangrene. No discrete fluid collection is identified to suggest drainable abscess at this time. Again  no perianal or perirectal involvement is noted. IMPRESSION: Changes consistent with Fournier's gangrene in the left perineum and left scrotum. Significant edematous changes are seen in the scrotal wall although no discrete fluid collection is noted. Cystic structure in the left hemiscrotum likely representing an epididymal cyst or spermatocele. This is stable from the prior ultrasound. Electronically Signed   By: Oneil Devonshire M.D.   On: 11/17/2023 21:05   US  SCROTUM W/DOPPLER Result Date: 11/17/2023 CLINICAL DATA:  10220 Abscess [id number], left testicular swelling and pain with fever EXAM: SCROTAL ULTRASOUND DOPPLER ULTRASOUND OF THE TESTICLES TECHNIQUE: Complete ultrasound examination of the testicles, epididymis, and other scrotal structures was performed. Color and  spectral Doppler ultrasound were also utilized to evaluate blood flow to the testicles. COMPARISON:  February 03, 2019, June 15, 2018 FINDINGS: Right testicle Measurements: 5.1 x 2.2 x 4.3 cm. No mass or microlithiasis visualized. Left testicle Measurements: 5.5 x 3.1 x 2.4 cm. No mass or microlithiasis visualized. Right epididymis:  Normal in size and appearance. Left epididymis: Large, lobular cystic structure replacing the majority of the epididymal parenchyma, measuring 7.7 x 4.5 x 5.1 cm, and containing echogenic debris. Previously, this measured 5.9 x 3.6 x 4.9 cm. Hydrocele:  Trace right hydrocele. Varicocele:  None visualized. Pulsed Doppler interrogation of both testes demonstrates normal low resistance arterial and venous waveforms bilaterally. Scrotal wall thickening with subcutaneous edema. IMPRESSION: 1. Scrotal wall thickening with subcutaneous edema, which may represent changes of scrotal cellulitis or dependent edema. No well-formed abscess or drainable fluid collection visualized. 2. No testicular mass, findings of epididymo-orchitis, or changes of testicular torsion, at this time. 3. Large left epididymal cyst/spermatocele, measuring  7.7 x 4.5 x 5.1 cm (previously, 5.9 x 3.6 x 4.9 cm). Electronically Signed   By: Rogelia Myers M.D.   On: 11/17/2023 18:52    Microbiology: Recent Results (from the past 240 hours)  Blood culture (routine x 2)     Status: None   Collection Time: 11/17/23  7:58 PM   Specimen: BLOOD  Result Value Ref Range Status   Specimen Description BLOOD SITE NOT SPECIFIED  Final   Special Requests   Final    BOTTLES DRAWN AEROBIC AND ANAEROBIC Blood Culture adequate volume   Culture   Final    NO GROWTH 5 DAYS Performed at [facility], [address] [place], [place] [address]    Report Status 11/22/2023 FINAL  Final  Blood culture (routine x 2)     Status: None   Collection Time: 11/17/23  8:03 PM   Specimen: BLOOD  Result Value Ref Range Status   Specimen Description BLOOD SITE NOT SPECIFIED  Final   Special Requests   Final    BOTTLES DRAWN AEROBIC AND ANAEROBIC Blood Culture adequate volume   Culture   Final    NO GROWTH 5 DAYS Performed at [facility], [address] [place], [place] [address]    Report Status 11/22/2023 FINAL  Final  Aerobic/Anaerobic Culture w Gram Stain (surgical/deep wound)     Status: None   Collection Time: 11/17/23 11:35 PM   Specimen: Wound  Result Value Ref Range Status   Specimen Description WOUND  Final   Special Requests A  Final   Gram Stain   Final    FEW WBC PRESENT,BOTH PMN AND MONONUCLEAR ABUNDANT GRAM NEGATIVE RODS MODERATE GRAM POSITIVE COCCI IN PAIRS Performed at [facility], [address] [place], [place] [address]    Culture   Final    MIXED ANAEROBIC FLORA PRESENT.  CALL LAB IF FURTHER IID REQUIRED.   Report Status 11/20/2023 FINAL  Final  Aerobic/Anaerobic Culture w Gram Stain (surgical/deep wound)     Status: None   Collection Time: 11/17/23 11:39 PM   Specimen: Wound  Result Value Ref Range Status   Specimen Description WOUND  Final   Special Requests B  Final   Gram Stain   Final    NO WBC SEEN FEW GRAM  POSITIVE COCCI IN CLUSTERS FEW GRAM POSITIVE RODS Performed at [facility], [address] [place], [place] [address]    Culture   Final  MIXED ANAEROBIC FLORA PRESENT.  CALL LAB IF FURTHER IID REQUIRED.   Report Status 11/20/2023 FINAL  Final     Labs: Basic Metabolic Panel: Recent Labs  Lab 11/17/23 1810 11/18/23 0536 11/19/23 0638 11/21/23 0358 11/22/23 0720  NA 131* 135 132* 135 136  K 3.8 4.8 3.6 3.3* 3.7  CL 100 101 102 106 104  CO2 22 21* 18* 22 21*  GLUCOSE 317* 282* 216* 148* 146*  BUN 8 9 13 9 13   CREATININE 0.90 0.78 0.83 0.64 0.54*  CALCIUM 9.1 8.7* 8.3* 8.4* 8.4*  MG  --  1.9  --   --   --   PHOS  --  3.4  --   --   --    Liver Function Tests: Recent Labs  Lab 11/17/23 1810  AST 16  ALT 17  ALKPHOS 87  BILITOT 0.7  PROT 6.9  ALBUMIN 3.2*   No results for input(s): LIPASE, AMYLASE in the last 168 hours. No results for input(s): AMMONIA in the last 168 hours. CBC: Recent Labs  Lab 11/17/23 1810 11/18/23 0536 11/19/23 9361 11/20/23 0514 11/21/23 0358 11/22/23 0720  WBC 19.9* 22.0* 16.2* 12.1* 10.2 11.1*  NEUTROABS 17.2*  --   --   --   --   --   HGB 17.0 14.7 13.7 14.4 13.7 14.0  HCT 47.5 43.0 39.2 41.1 39.6 39.9  MCV 88.0 89.0 89.3 88.4 87.6 87.5  PLT 235 207 235 286 276 299   Cardiac Enzymes: No results for input(s): CKTOTAL, CKMB, CKMBINDEX, TROPONINI in the last 168 hours. BNP: BNP (last 3 results) No results for input(s): BNP in the last 8760 hours.  ProBNP (last 3 results) No results for input(s): PROBNP in the last 8760 hours.  CBG: Recent Labs  Lab 11/21/23 0736 11/21/23 1017 11/21/23 1616 11/21/23 2001 11/22/23 0843  GLUCAP 159* 174* 189* 188* 175*    Signed:  [name] MD   [facility] 11/22/2023, 11:21 AM

## 2023-11-23 LAB — GLUCOSE, CAPILLARY: Glucose-Capillary: 181 mg/dL — ABNORMAL HIGH (ref 70–99)

## 2023-11-30 DIAGNOSIS — L732 Hidradenitis suppurativa: Secondary | ICD-10-CM | POA: Diagnosis not present

## 2023-11-30 DIAGNOSIS — N493 Fournier gangrene: Secondary | ICD-10-CM | POA: Diagnosis not present

## 2023-11-30 DIAGNOSIS — Z09 Encounter for follow-up examination after completed treatment for conditions other than malignant neoplasm: Secondary | ICD-10-CM | POA: Diagnosis not present

## 2023-11-30 DIAGNOSIS — E11628 Type 2 diabetes mellitus with other skin complications: Secondary | ICD-10-CM | POA: Diagnosis not present

## 2023-11-30 DIAGNOSIS — L309 Dermatitis, unspecified: Secondary | ICD-10-CM | POA: Diagnosis not present

## 2023-11-30 DIAGNOSIS — Z794 Long term (current) use of insulin: Secondary | ICD-10-CM | POA: Diagnosis not present

## 2024-01-18 NOTE — Telephone Encounter (Signed)
 Metformin  rx refill. Please advise.   Authorizing Provider: HISTORICAL PROVIDER, CONVERSION [address] [place] [address] Phone: [phone] DEA #: --   NPI: -- --

## 2024-01-21 NOTE — Telephone Encounter (Signed)
 Received fax through [facility] pharmacy: Insurance prefers humalog. Rx pended. Please advise.
